# Patient Record
Sex: Female | Born: 1990 | Race: Black or African American | Hispanic: No | Marital: Single | Smoking: Former smoker
Health system: Southern US, Community
[De-identification: ages and names within clinical notes are randomized; demographics above are authoritative.]

## PROBLEM LIST (undated history)

## (undated) DIAGNOSIS — O26649 Intrahepatic cholestasis of pregnancy, unspecified trimester: Secondary | ICD-10-CM

## (undated) DIAGNOSIS — R569 Unspecified convulsions: Secondary | ICD-10-CM

## (undated) DIAGNOSIS — M419 Scoliosis, unspecified: Secondary | ICD-10-CM

## (undated) DIAGNOSIS — J0510 Acute epiglottitis without obstruction: Secondary | ICD-10-CM

## (undated) HISTORY — DX: Intrahepatic cholestasis of pregnancy, unspecified trimester: O26.649

## (undated) HISTORY — PX: NO PAST SURGERIES: SHX2092

## (undated) HISTORY — DX: Scoliosis, unspecified: M41.9

---

## 2017-09-07 ENCOUNTER — Emergency Department (HOSPITAL_COMMUNITY)
Admission: EM | Admit: 2017-09-07 | Discharge: 2017-09-07 | Disposition: A | Discharge status: Home / Self Care | Payer: Self-pay | Attending: Emergency Medicine | Admitting: Emergency Medicine

## 2017-09-07 ENCOUNTER — Encounter (HOSPITAL_COMMUNITY): Payer: Self-pay | Admitting: Emergency Medicine

## 2017-09-07 DIAGNOSIS — K0889 Other specified disorders of teeth and supporting structures: Secondary | ICD-10-CM | POA: Insufficient documentation

## 2017-09-07 DIAGNOSIS — K1120 Sialoadenitis, unspecified: Secondary | ICD-10-CM | POA: Insufficient documentation

## 2017-09-07 DIAGNOSIS — F17210 Nicotine dependence, cigarettes, uncomplicated: Secondary | ICD-10-CM | POA: Insufficient documentation

## 2017-09-07 MED ORDER — OXYCODONE-ACETAMINOPHEN 5-325 MG PO TABS: 1.0000 | ORAL_TABLET | Freq: Four times a day (QID) | ORAL | 0 refills | Status: DC | PRN

## 2017-09-07 NOTE — Discharge Instructions (Addendum)
Please read attached information. If you experience any new or worsening signs or symptoms please return to the emergency room for evaluation. Please follow-up with your primary care provider or specialist as discussed. Please use medication prescribed only as directed and discontinue taking if you have any concerning signs or symptoms.   °

## 2017-09-07 NOTE — ED Provider Notes (Signed)
[facility] EMERGENCY DEPARTMENT Provider Note   CSN: [id number] Arrival date & time: 09/07/17  2053     History   Chief Complaint Chief Complaint  Patient presents with  . Dental Pain    HPI Myia Bergh is a 27 y.o. female.  HPI   27 year old female presents today with complaints of dental pain.  Patient notes that approximately week and half ago she developed pain to her left lower molar, she notes that she did swelling, also some pain on the right side.  She was seen at [facility] on 09/02/2017.  She was diagnosed with parotitis, started on Unasyn and admitted.  Patient was discharged the following day with diagnosis of parotitis and dental caries.  Patient notes she still continues to have pain in the bilateral molars, she notes the swelling has resolved, she denies any fever chills nausea or vomiting, pooling of secretions or difficulty swallowing.  Patient was discharged home on clindamycin and continues to take this.  She is attempting to follow-up with dental resources.   History reviewed. No pertinent past medical history.  There are no active problems to display for this patient.   History reviewed. No pertinent surgical history.   OB History   None      Home Medications    Prior to Admission medications   Medication Sig Start Date End Date Taking? Authorizing Provider  oxyCODONE-acetaminophen (PERCOCET/ROXICET) 5-325 MG tablet Take 1 tablet by mouth every 6 (six) hours as needed for severe pain. 09/07/17   Eyvonne Mechanic, PA-C    Family History No family history on file.  Social History Social History   Tobacco Use  . Smoking status: Current Every Day Smoker    Packs/day: 1.00    Types: Cigarettes  . Smokeless tobacco: Never Used  Substance Use Topics  . Alcohol use: Not Currently  . Drug use: Not Currently     Allergies   Peanut-containing drug products   Review of Systems Review of Systems  All other  systems reviewed and are negative.    Physical Exam Updated Vital Signs BP 134/86 (BP Location: Right Arm)   Pulse 84   Temp 98.6 F (37 C) (Oral)   Resp 18   Ht  (1.6 m)   Wt 49.9 kg (110 lb)   LMP 08/06/2017   SpO2 100%   BMI 19.49 kg/m   Physical Exam  Constitutional: She is oriented to person, place, and time. She appears well-developed and well-nourished.  HENT:  Head: Normocephalic and atraumatic.  Numerous dental caries throughout, gumline palpated nontender, floor the mouth is soft, neck is supple full active range of motion-no significant swelling noted to the bilateral parotid  Eyes: Pupils are equal, round, and reactive to light. Conjunctivae are normal. Right eye exhibits no discharge. Left eye exhibits no discharge. No scleral icterus.  Neck: Normal range of motion. No JVD present. No tracheal deviation present.  Pulmonary/Chest: Effort normal. No stridor.  Neurological: She is alert and oriented to person, place, and time. Coordination normal.  Psychiatric: She has a normal mood and affect. Her behavior is normal. Judgment and thought content normal.  Nursing note and vitals reviewed.    ED Treatments / Results  Labs (all labs ordered are listed, but only abnormal results are displayed) Labs Reviewed - No data to display  EKG None  Radiology No results found.  Procedures Procedures (including critical care time)  Medications Ordered in ED Medications - No data to display  Initial Impression / Assessment and Plan / ED Course  I have reviewed the triage vital signs and the nursing notes.  Pertinent labs & imaging results that were available during my care of the patient were reviewed by me and considered in my medical decision making (see chart for details).     27 year old female presents today with complaints of dental pain.  Patient with recent diagnosis of parotiditis.  She has markedly improved symptoms, she is afebrile well-appearing.   Patient continues to have discomfort, question dental versus ongoing parotitis.  Patient is on antibiotics, she is encouraged to continue using these, she will be given short course of pain medicine, referred to outpatient dentistry.  She is given strict return precautions, she verbalized understanding and agreement to today's plan had no further questions or concerns  Final Clinical Impressions(s) / ED Diagnoses   Final diagnoses:  Pain, dental  Parotitis    ED Discharge Orders        Ordered    oxyCODONE-acetaminophen (PERCOCET/ROXICET) 5-325 MG tablet  Every 6 hours PRN     09/07/17 2115       Eyvonne Mechanic, PA-C 09/07/17 2115    Charlynne Pander, MD 09/07/17 2207

## 2017-09-07 NOTE — ED Triage Notes (Signed)
Pt seen at [facility] 5/17 for dental pain, dx with dental abscess to L lower tooth and parotiditis. Pt was supposed to follow up with dentist but never did. Pt states the dental pain is starting to return.

## 2017-09-14 DIAGNOSIS — F17210 Nicotine dependence, cigarettes, uncomplicated: Secondary | ICD-10-CM | POA: Insufficient documentation

## 2017-09-14 DIAGNOSIS — Z91010 Allergy to peanuts: Secondary | ICD-10-CM | POA: Insufficient documentation

## 2017-09-14 DIAGNOSIS — K0889 Other specified disorders of teeth and supporting structures: Secondary | ICD-10-CM | POA: Insufficient documentation

## 2017-09-15 ENCOUNTER — Emergency Department (HOSPITAL_COMMUNITY)
Admission: EM | Admit: 2017-09-15 | Discharge: 2017-09-15 | Disposition: A | Discharge status: Home / Self Care | Payer: Self-pay | Attending: Emergency Medicine | Admitting: Emergency Medicine

## 2017-09-15 ENCOUNTER — Encounter (HOSPITAL_COMMUNITY): Payer: Self-pay | Admitting: Emergency Medicine

## 2017-09-15 DIAGNOSIS — K0889 Other specified disorders of teeth and supporting structures: Secondary | ICD-10-CM

## 2017-09-15 MED ORDER — CHLORHEXIDINE GLUCONATE 0.12 % MT SOLN: 15.0000 mL | Freq: Two times a day (BID) | OROMUCOSAL | 0 refills | Status: DC

## 2017-09-15 NOTE — ED Notes (Signed)
Discharge instructions and prescription discussed with Pt. Pt verbalized understanding. Pt stable and ambulatory.    

## 2017-09-15 NOTE — ED Triage Notes (Signed)
Pt continues to have dental pain, continues to take antibiotics and has an appointment on the 7th w/ a dentist however is unable to control the pain now.

## 2017-09-15 NOTE — ED Provider Notes (Signed)
[facility]ER[facility]ENC[facility] Provider Note   CSN: [id number] Arrival date & time: 09/14/17  2351     History   Chief Complaint Chief Complaint  Patient presents with  . Dental Pain    HPI [name] is a 27 y.o. female.  Patient presents to the emergency department with a chief complaint of dental pain.  She states that she has had a tooth infection.  She has been taking antibiotics as directed.  She does not have a dentist and needs a referral.  She states that the pain is unbearable.  She denies any other associated symptoms.  The history is provided by the patient. No language interpreter was used.    History reviewed. No pertinent past medical history.  There are no active problems to display for this patient.   History reviewed. No pertinent surgical history.   OB History   None      Home Medications    Prior to Admission medications   Medication Sig Start Date End Date Taking? Authorizing Provider  oxyCODONE-acetaminophen (PERCOCET/ROXICET) 5-325 MG tablet Take 1 tablet by mouth every 6 (six) hours as needed for severe pain. 09/07/17   Eyvonne MechanicHedges, Jeffrey, PA-C    Family History No family history on file.  Social History Social History   Tobacco Use  . Smoking status: Current Every Day Smoker    Packs/day: 1.00    Types: Cigarettes  . Smokeless tobacco: Never Used  Substance Use Topics  . Alcohol use: Not Currently  . Drug use: Not Currently     Allergies   Peanut-containing drug products   Review of Systems Review of Systems  Constitutional: Negative for chills and fever.  HENT: Positive for dental problem. Negative for drooling.   Neurological: Negative for speech difficulty.  Psychiatric/Behavioral: Positive for sleep disturbance.     Physical Exam Updated Vital Signs BP 97/66 (BP Location: Right Arm)   Pulse 64   Temp 97.7 F (36.5 C) (Oral)   Resp 14   Ht 5\' 3"  (1.6 m)   Wt 54.4 kg (120 lb)   SpO2 100%    BMI 21.26 kg/m   Physical Exam Physical Exam  Constitutional: Pt appears well-developed and well-nourished.  HENT:  Head: Normocephalic.  Right Ear: Tympanic membrane, external ear and ear canal normal.  Left Ear: Tympanic membrane, external ear and ear canal normal.  Nose: Nose normal. Right sinus exhibits no maxillary sinus tenderness and no frontal sinus tenderness. Left sinus exhibits no maxillary sinus tenderness and no frontal sinus tenderness.  Mouth/Throat: Uvula is midline, oropharynx is clear and moist and mucous membranes are normal. No oral lesions. No uvula swelling or lacerations. No oropharyngeal exudate, posterior oropharyngeal edema, posterior oropharyngeal erythema or tonsillar abscesses.  Poor dentition No gingival swelling, fluctuance or induration No gross abscess  No sublingual edema, tenderness to palpation, or sign of Ludwig's angina, or deep space infection Pain at left lower rear molar Eyes: Conjunctivae are normal. Pupils are equal, round, and reactive to light. Right eye exhibits no discharge. Left eye exhibits no discharge.  Neck: Normal range of motion. Neck supple.  No stridor Handling secretions without difficulty No nuchal rigidity No cervical lymphadenopathy Cardiovascular: Normal rate, regular rhythm and normal heart sounds.   Pulmonary/Chest: Effort normal. No respiratory distress.  Equal chest rise  Abdominal: Soft. Bowel sounds are normal. Pt exhibits no distension. There is no tenderness.  Lymphadenopathy: Pt has no cervical adenopathy.  Neurological: Pt is alert and oriented x 4  Skin: Skin is warm and dry.  Psychiatric: Pt has a normal mood and affect.  Nursing note and vitals reviewed.    ED Treatments / Results  Labs (all labs ordered are listed, but only abnormal results are displayed) Labs Reviewed - No data to display  EKG None  Radiology No results found.  Procedures Procedures (including critical care  time)  Medications Ordered in ED Medications - No data to display   Initial Impression / Assessment and Plan / ED Course  I have reviewed the triage vital signs and the nursing notes.  Pertinent labs & imaging results that were available during my care of the patient were reviewed by me and considered in my medical decision making (see chart for details).     Patient with dentalgia.  No abscess requiring immediate incision and drainage.  Exam not concerning for Ludwig's angina or pharyngeal abscess.  Will treat with periodex; currently taking oral abx. Pt instructed to follow-up with dentist.  Discussed return precautions. Pt safe for discharge.   Final Clinical Impressions(s) / ED Diagnoses   Final diagnoses:  Pain, dental    ED Discharge Orders        Ordered    chlorhexidine (PERIDEX) 0.12 % solution  2 times daily     09/15/17 0537       Roxy Horseman, PA-C 09/15/17 1610    Geoffery Lyons, MD 09/16/17 0002

## 2018-03-18 ENCOUNTER — Other Ambulatory Visit: Payer: Self-pay

## 2018-03-18 ENCOUNTER — Encounter (HOSPITAL_COMMUNITY): Payer: Self-pay | Admitting: *Deleted

## 2018-03-18 ENCOUNTER — Emergency Department (HOSPITAL_COMMUNITY)
Admission: EM | Admit: 2018-03-18 | Discharge: 2018-03-18 | Disposition: A | Discharge status: Home / Self Care | Payer: Self-pay | Attending: Emergency Medicine | Admitting: Emergency Medicine

## 2018-03-18 DIAGNOSIS — Z79899 Other long term (current) drug therapy: Secondary | ICD-10-CM | POA: Insufficient documentation

## 2018-03-18 DIAGNOSIS — F17210 Nicotine dependence, cigarettes, uncomplicated: Secondary | ICD-10-CM | POA: Insufficient documentation

## 2018-03-18 DIAGNOSIS — K0889 Other specified disorders of teeth and supporting structures: Secondary | ICD-10-CM | POA: Insufficient documentation

## 2018-03-18 MED ORDER — NAPROXEN 500 MG PO TABS: 500.0000 mg | ORAL_TABLET | Freq: Two times a day (BID) | ORAL | 0 refills | Status: DC

## 2018-03-18 MED ORDER — ACETAMINOPHEN 325 MG PO TABS
650.0000 mg | ORAL_TABLET | Freq: Once | ORAL | Status: AC
Start: 2018-03-18 — End: 2018-03-18
  Administered 2018-03-18: 650 mg via ORAL
  Filled 2018-03-18: qty 2

## 2018-03-18 MED ORDER — CHLORHEXIDINE GLUCONATE 0.12 % MT SOLN
15.0000 mL | Freq: Two times a day (BID) | OROMUCOSAL | 0 refills | Status: DC
Start: 2018-03-18 — End: 2018-08-01

## 2018-03-18 MED ORDER — AMOXICILLIN-POT CLAVULANATE 875-125 MG PO TABS: 1.0000 | ORAL_TABLET | Freq: Two times a day (BID) | ORAL | 0 refills | Status: AC

## 2018-03-18 NOTE — ED Provider Notes (Signed)
[facility] EMERGENCY DEPARTMENT Provider Note   CSN: [id number] Arrival date & time: 03/18/18  4098     History   Chief Complaint Chief Complaint  Patient presents with  . Dental Pain    HPI [name] is a 27 y.o. female presenting with intermittent left sided dental pain onset yesterday at 9am. Patient describes pain as sharp and states it is non radiating. Patient reports she has tried ibuprofen, tylenol, and warm compress without relief. Patient states she has had dental infections in the past. Patient reports a subjective fever, left sided facial swelling, and left ear pain. Pt denies dysphagia, shortness of breath, abdominal pain, nausea, or vomiting. Patient states she is able to eat and swallow secretions.   HPI  History reviewed. No pertinent past medical history.  There are no active problems to display for this patient.   History reviewed. No pertinent surgical history.   OB History   None      Home Medications    Prior to Admission medications   Medication Sig Start Date End Date Taking? Authorizing Provider  amoxicillin-clavulanate (AUGMENTIN) 875-125 MG tablet Take 1 tablet by mouth 2 (two) times daily for 7 days. 03/18/18 03/25/18  Carlyle Basques P, PA-C  chlorhexidine (PERIDEX) 0.12 % solution Use as directed 15 mLs in the mouth or throat 2 (two) times daily. 03/18/18   Carlyle Basques P, PA-C  naproxen (NAPROSYN) 500 MG tablet Take 1 tablet (500 mg total) by mouth 2 (two) times daily. 03/18/18   Carlyle Basques P, PA-C  oxyCODONE-acetaminophen (PERCOCET/ROXICET) 5-325 MG tablet Take 1 tablet by mouth every 6 (six) hours as needed for severe pain. 09/07/17   Eyvonne Mechanic, PA-C    Family History History reviewed. No pertinent family history.  Social History Social History   Tobacco Use  . Smoking status: Current Every Day Smoker    Packs/day: 1.00    Types: Cigarettes  . Smokeless tobacco: Never Used  Substance Use Topics  .  Alcohol use: Not Currently  . Drug use: Not Currently     Allergies   Peanut-containing drug products   Review of Systems Review of Systems  Constitutional: Positive for fever. Negative for chills and diaphoresis.  HENT: Positive for dental problem, ear pain and facial swelling. Negative for congestion, drooling, rhinorrhea, sore throat, trouble swallowing and voice change.   Respiratory: Negative for cough and shortness of breath.   Gastrointestinal: Negative for abdominal pain, nausea and vomiting.  Skin: Negative for wound.  Allergic/Immunologic: Negative for immunocompromised state.  Hematological: Negative for adenopathy.  Psychiatric/Behavioral: The patient is not nervous/anxious.      Physical Exam Updated Vital Signs BP (!) 118/96 (BP Location: Right Arm)   Pulse 95   Temp 98.4 F (36.9 C) (Oral)   Resp 16   Ht 5\' 3"  (1.6 m)   Wt 49.9 kg   LMP 02/27/2018   SpO2 100%   BMI 19.49 kg/m   Physical Exam  Constitutional: She appears well-developed and well-nourished. No distress.  HENT:  Head: Normocephalic and atraumatic.  Right Ear: External ear and ear canal normal. No mastoid tenderness. No decreased hearing is noted.  Left Ear: External ear and ear canal normal. No mastoid tenderness. No decreased hearing is noted.  Nose: Nose normal.  Mouth/Throat: Uvula is midline, oropharynx is clear and moist and mucous membranes are normal. No trismus in the jaw. Dental abscesses and dental caries present.  Multiple dental caries noted on exam. Small dental abscess noted  on left upper dental molar. Abscess is small and does not require incision and drainage at this time. Left sided facial edema noted on exam. Patient is able to speak in full sentences and swallow secretions without difficulty. No drooling present. Bilateral TMs were not visualized due to large amount of ear wax present. No signs of ludwig's angina noted on exam.   Neck: Normal range of motion. Neck supple.    Cardiovascular: Normal rate, regular rhythm and normal heart sounds. Exam reveals no gallop and no friction rub.  No murmur heard. Pulmonary/Chest: Effort normal and breath sounds normal. No respiratory distress. She has no wheezes.  Abdominal: Soft. She exhibits no distension. There is no tenderness. There is no guarding.  Musculoskeletal: Normal range of motion.  Neurological: She is alert.  Skin: Skin is warm. No rash noted. She is not diaphoretic. No erythema.  Psychiatric: She has a normal mood and affect.  Nursing note and vitals reviewed.    ED Treatments / Results  Labs (all labs ordered are listed, but only abnormal results are displayed) Labs Reviewed - No data to display  EKG None  Radiology No results found.  Procedures Procedures (including critical care time)  Medications Ordered in ED Medications  acetaminophen (TYLENOL) tablet 650 mg (650 mg Oral Given 03/18/18 0931)     Initial Impression / Assessment and Plan / ED Course  I have reviewed the triage vital signs and the nursing notes.  Pertinent labs & imaging results that were available during my care of the patient were reviewed by me and considered in my medical decision making (see chart for details).    Patient with toothache and a small dental abscess noted. Dental abscess does not appear to require incision and drainage at this time. Provided tylenol for pain control. Exam unconcerning for Ludwig's angina or spread of infection.  Will treat with Augmentin and anti-inflammatories medicine.  Urged patient to follow-up with dentist.     Final Clinical Impressions(s) / ED Diagnoses   Final diagnoses:  Pain, dental    ED Discharge Orders         Ordered    naproxen (NAPROSYN) 500 MG tablet  2 times daily     03/18/18 0937    amoxicillin-clavulanate (AUGMENTIN) 875-125 MG tablet  2 times daily     03/18/18 0937    chlorhexidine (PERIDEX) 0.12 % solution  2 times daily     03/18/18 [id number]            Leretha DykesHernandez, Lillis Nuttle P, PA-C 03/18/18 0940    Bethann BerkshireZammit, Joseph, MD 03/18/18 1048

## 2018-03-18 NOTE — ED Triage Notes (Signed)
Pt reports Lt sided dental pain with facial swelling on Lt side of face. Pt unsure of tooth is broken.

## 2018-03-18 NOTE — ED Notes (Signed)
Declined W/C at D/C and was escorted to lobby by RN. 

## 2018-03-18 NOTE — Discharge Instructions (Signed)
You have been seen today for dental pain. Please read and follow all provided instructions.   1. Medications: naproxen for pain, Augmentin (antibiotic) for dental infection, Peridex solution for dental pain, usual home medications 2. Treatment: rest, drink plenty of fluids 3. Follow Up: Please follow up with your primary doctor in 3 days for discussion of your diagnoses and further evaluation after today's visit; if you do not have a primary care doctor use the resource guide provided to find one; Please return to the ER for any new or worsening symptoms.   Take medications as prescribed. Return to the emergency room for worsening condition or new concerning symptoms. Follow up with your regular doctor. If you don't have a regular doctor use one of the numbers below to establish a primary care doctor.   Emergency Department Resource Guide 1) Find a Doctor and Pay Out of Pocket Although you won't have to find out who is covered by your insurance plan, it is a good idea to ask around and get recommendations. You will then need to call the office and see if the doctor you have chosen will accept you as a new patient and what types of options they offer for patients who are self-pay. Some doctors offer discounts or will set up payment plans for their patients who do not have insurance, but you will need to ask so you aren't surprised when you get to your appointment.  2) Contact Your Local Health Department Not all health departments have doctors that can see patients for sick visits, but many do, so it is worth a call to see if yours does. If you don't know where your local health department is, you can check in your phone book. The CDC also has a tool to help you locate your state's health department, and many state websites also have listings of all of their local health departments.  3) Find a Walk-in Clinic If your illness is not likely to be very severe or complicated, you may want to try a walk  in clinic. These are popping up all over the country in pharmacies, drugstores, and shopping centers. They're usually staffed by nurse practitioners or physician assistants that have been trained to treat common illnesses and complaints. They're usually fairly quick and inexpensive. However, if you have serious medical issues or chronic medical problems, these are probably not your best option.  No Primary Care Doctor: Call Health Connect at  [phone] - they can help you locate a primary care doctor that  accepts your insurance, provides certain services, etc. Physician Referral [phone]  Emergency Department Resource Guide 1) Find a Doctor and Pay Out of Pocket Although you won't have to find out who is covered by your insurance plan, it is a good idea to ask around and get recommendations. You will then need to call the office and see if the doctor you have chosen will accept you as a new patient and what types of options they offer for patients who are self-pay. Some doctors offer discounts or will set up payment plans for their patients who do not have insurance, but you will need to ask so you aren't surprised when you get to your appointment.  2) Contact Your Local Health Department Not all health departments have doctors that can see patients for sick visits, but many do, so it is worth a call to see if yours does. If you don't know where your local health department is, you can check in  your phone book. The CDC also has a tool to help you locate your state's health department, and many state websites also have listings of all of their local health departments.  3) Find a Walk-in Clinic If your illness is not likely to be very severe or complicated, you may want to try a walk in clinic. These are popping up all over the country in pharmacies, drugstores, and shopping centers. They're usually staffed by nurse practitioners or physician assistants that have been trained to treat  common illnesses and complaints. They're usually fairly quick and inexpensive. However, if you have serious medical issues or chronic medical problems, these are probably not your best option.  No Primary Care Doctor: Call Health Connect at  [phone] - they can help you locate a primary care doctor that  accepts your insurance, provides certain services, etc. Physician Referral Service- [phone]  Chronic Pain Problems: Organization         Address  Phone   Notes  [facility]  [phone] Patients need to be referred by their primary care doctor.   Medication Assistance: Organization         Address  Phone   Notes  [facility] Medication [facility] [address] [place] [place], [place] [address] [phone] --Must be a resident of [facility] -- Must have NO insurance coverage whatsoever (no Medicaid/ Medicare, etc.) -- The pt. MUST have a primary care doctor that directs their care regularly and follows them in the community   [facility]  [phone]   [employer]  [phone]    Agencies that provide inexpensive medical care: Organization         Address  Phone   Notes  [facility]  [phone]   [facility]    [phone]   [facility] [address] [place], [place] [address] [phone]   [facility] [address] [place] [place] [phone]   [facility]    [phone]   [facility]    [phone]   [facility]  [address], [place] Phone:  [phone], Fax:  [phone] Hours of Operation:  9 am - 6 pm, M-F.  Also accepts Medicaid/Medicare and self-pay.  [facility]  [address], [place] Phone: [phone], Fax: [phone]. Hours of Operation:  8:30 am - 5:30 pm, M-F.  Also accepts Medicaid and self-pay.  [facility] [place] [address], [place] Phone: [phone]   [facility] [address] Natasha Bence [place], [place] [phone], Ext. [phone] Mondays & Thursdays: 7-9 AM.  First 15 patients are seen on a first come, first serve basis.    Medicaid-accepting [facility] Providers:  Organization         Address  Phone   Notes  [facility] [address], [place] [phone] Also accepts self-pay patients.  [facility] [address] Laurell Josephs [place], [place]  [phone]   [facility] [address], [place] [phone]   [facility] [address], [place] [phone]   Renaye Rakers [address], [place]   [phone] Only accepts [place] patients after they have their name applied to their card.  Self-Pay (no insurance) in [place]:  Organization         Address  Phone   Notes  Sickle Cell Patients, [facility] [address], [place] [phone]   [facility] [address], [place] [phone]) [phone]   [facility]  [address], [place] [phone]   [facility]/Dr. Osei-Bonsu  [address] [place] [address], [place] or [phone], [place] [phone]) [phone] Phone number for both [place] and [place] locations is the same.  [facility] [address], [place] [phone](336) [phone]   [facility] [place] [address] [place] [address], [place] or [address] [phone]   [facility] [address], [place] [phone]) [phone], phone; [phone], fax Sees patients 1st and 3rd Saturday of every month.  Must not qualify for public or private insurance (i.e. Medicaid, Medicare,  Health Choice, Veterans' Benefits)  Household income should be no more than 200% of the  poverty level The clinic cannot treat you if you are pregnant or think you are pregnant  Sexually transmitted diseases are not treated at the clinic.

## 2018-06-25 ENCOUNTER — Encounter (HOSPITAL_COMMUNITY): Payer: Self-pay | Admitting: Emergency Medicine

## 2018-06-25 ENCOUNTER — Emergency Department (HOSPITAL_COMMUNITY)
Admission: EM | Admit: 2018-06-25 | Discharge: 2018-06-26 | Disposition: A | Discharge status: Home / Self Care | Payer: Self-pay | Attending: Emergency Medicine | Admitting: Emergency Medicine

## 2018-06-25 ENCOUNTER — Other Ambulatory Visit: Payer: Self-pay

## 2018-06-25 DIAGNOSIS — J029 Acute pharyngitis, unspecified: Secondary | ICD-10-CM | POA: Insufficient documentation

## 2018-06-25 DIAGNOSIS — R0981 Nasal congestion: Secondary | ICD-10-CM | POA: Insufficient documentation

## 2018-06-25 DIAGNOSIS — R112 Nausea with vomiting, unspecified: Secondary | ICD-10-CM | POA: Insufficient documentation

## 2018-06-25 DIAGNOSIS — R509 Fever, unspecified: Secondary | ICD-10-CM | POA: Insufficient documentation

## 2018-06-25 DIAGNOSIS — R197 Diarrhea, unspecified: Secondary | ICD-10-CM | POA: Insufficient documentation

## 2018-06-25 DIAGNOSIS — F17210 Nicotine dependence, cigarettes, uncomplicated: Secondary | ICD-10-CM | POA: Insufficient documentation

## 2018-06-25 LAB — COMPREHENSIVE METABOLIC PANEL
ALT: 11 U/L (ref 0–44)
AST: 24 U/L (ref 15–41)
Albumin: 3.6 g/dL (ref 3.5–5.0)
Alkaline Phosphatase: 53 U/L (ref 38–126)
Anion gap: 5 (ref 5–15)
BUN: 10 mg/dL (ref 6–20)
CHLORIDE: 105 mmol/L (ref 98–111)
CO2: 28 mmol/L (ref 22–32)
CREATININE: 0.83 mg/dL (ref 0.44–1.00)
Calcium: 8.7 mg/dL — ABNORMAL LOW (ref 8.9–10.3)
GFR calc Af Amer: >60 mL/min (ref >60)
Glucose, Bld: 117 mg/dL — ABNORMAL HIGH (ref 70–99)
POTASSIUM: 3.7 mmol/L (ref 3.5–5.1)
Sodium: 138 mmol/L (ref 135–145)
TOTAL PROTEIN: 6.5 g/dL (ref 6.5–8.1)
Total Bilirubin: 1.2 mg/dL (ref 0.3–1.2)

## 2018-06-25 LAB — URINALYSIS, ROUTINE W REFLEX MICROSCOPIC
BILIRUBIN URINE: NEGATIVE
GLUCOSE, UA: NEGATIVE mg/dL
Ketones, ur: NEGATIVE mg/dL
Nitrite: NEGATIVE
Protein, ur: NEGATIVE mg/dL
SPECIFIC GRAVITY, URINE: 1.021 (ref 1.005–1.030)
pH: 5 (ref 5.0–8.0)

## 2018-06-25 LAB — I-STAT BETA HCG BLOOD, ED (MC, WL, AP ONLY)

## 2018-06-25 LAB — CBC
HEMATOCRIT: 40.3 % (ref 36.0–46.0)
HEMOGLOBIN: 13.7 g/dL (ref 12.0–15.0)
MCH: 29.3 pg (ref 26.0–34.0)
MCHC: 34 g/dL (ref 30.0–36.0)
MCV: 86.1 fL (ref 80.0–100.0)
Platelets: 204 10*3/uL (ref 150–400)
RBC: 4.68 MIL/uL (ref 3.87–5.11)
RDW: 12.7 % (ref 11.5–15.5)
WBC: 4.3 10*3/uL (ref 4.0–10.5)
nRBC: 0 % (ref 0.0–0.2)

## 2018-06-25 LAB — LIPASE, BLOOD: LIPASE: 21 U/L (ref 11–51)

## 2018-06-25 MED ORDER — SODIUM CHLORIDE 0.9% FLUSH: 3.0000 mL | Freq: Once | INTRAVENOUS | Status: DC

## 2018-06-25 NOTE — ED Triage Notes (Signed)
Pt c/o nausea/vomiting/diarrhea and chills that started today. Denies abdominal pain or urinary symptoms.

## 2018-06-26 LAB — INFLUENZA PANEL BY PCR (TYPE A & B)
Influenza A By PCR: NEGATIVE
Influenza B By PCR: NEGATIVE

## 2018-06-26 MED ORDER — ONDANSETRON 4 MG PO TBDP
4.0000 mg | ORAL_TABLET | Freq: Once | ORAL | Status: AC
  Administered 2018-06-26: 4 mg via ORAL
  Filled 2018-06-26: qty 1

## 2018-06-26 MED ORDER — ACETAMINOPHEN 500 MG PO TABS
1000.0000 mg | ORAL_TABLET | Freq: Once | ORAL | Status: AC
  Administered 2018-06-26: 1000 mg via ORAL
  Filled 2018-06-26: qty 2

## 2018-06-26 MED ORDER — ONDANSETRON 4 MG PO TBDP: 4.0000 mg | ORAL_TABLET | Freq: Three times a day (TID) | ORAL | 0 refills | Status: DC | PRN

## 2018-06-26 MED ORDER — ONDANSETRON HCL 4 MG/2ML IJ SOLN: 4.0000 mg | Freq: Once | INTRAMUSCULAR | Status: DC

## 2018-06-26 NOTE — Discharge Instructions (Addendum)
1. Medications: Alternate 600 mg of ibuprofen and [id number] mg of Tylenol every 3 hours as needed for pain or fever. Do not exceed 4000 mg of Tylenol daily.  Take ibuprofen with food to avoid upset stomach.  Take Zofran as needed for nausea.  Let this dissolve under your tongue and wait around 10-20 minutes before eating or drinking after taking this medication. 2. Treatment: rest, drink plenty of fluids, advance diet slowly.  Start with water and broth then advance to bland foods that will not upset your stomach such as crackers, mashed potatoes, and peanut butter. 3. Follow Up: Please followup with your primary doctor in 3 days for discussion of your diagnoses and further evaluation after today's visit; if you do not have a primary care doctor use the resource guide provided to find one; Please return to the ER for persistent vomiting, high fevers or worsening symptoms

## 2018-06-26 NOTE — ED Notes (Signed)
Patient given ginger ale. 

## 2018-06-26 NOTE — ED Provider Notes (Signed)
[name] EMERGENCY DEPARTMENT Provider Note   CSN: [id number] Arrival date & time: 06/25/18  1848    History   Chief Complaint Chief Complaint  Patient presents with  . Emesis  . Diarrhea    HPI [name] is a 28 y.o. female with no significant past medical history presents today for evaluation of cute onset, progressively worsening nausea vomiting and diarrhea yesterday as well as flulike symptoms 2 days ago.  She reports sore throat, mild nasal congestion.  Has had approximately 10 episodes of nonbloody nonbilious emesis and watery nonbloody diarrhea yesterday into today.  Denies abdominal pain, urinary symptoms, melena, hematochezia, shortness of breath, or chest pain.  Notes fevers and chills.  Endorses decreased appetite and decreased oral intake.  Has not tried anything for her symptoms.  Notes recent sick contact with the flu.  Denies suspicious food intake, recent travel, or recent treatment with antibiotics.     The history is provided by the patient.    History reviewed. No pertinent past medical history.  There are no active problems to display for this patient.   History reviewed. No pertinent surgical history.   OB History   No obstetric history on file.      Home Medications    Prior to Admission medications   Medication Sig Start Date End Date Taking? Authorizing Provider  chlorhexidine (PERIDEX) 0.12 % solution Use as directed 15 mLs in the mouth or throat 2 (two) times daily. Patient not taking: Reported on 06/26/2018 03/18/18   Carlyle Basques P, PA-C  naproxen (NAPROSYN) 500 MG tablet Take 1 tablet (500 mg total) by mouth 2 (two) times daily. Patient not taking: Reported on 06/26/2018 03/18/18   Carlyle Basques P, PA-C  ondansetron (ZOFRAN ODT) 4 MG disintegrating tablet Take 1 tablet (4 mg total) by mouth every 8 (eight) hours as needed for nausea or vomiting. 06/26/18   Michela Pitcher A, PA-C  oxyCODONE-acetaminophen  (PERCOCET/ROXICET) 5-325 MG tablet Take 1 tablet by mouth every 6 (six) hours as needed for severe pain. Patient not taking: Reported on 06/26/2018 09/07/17   Eyvonne Mechanic, PA-C    Family History No family history on file.  Social History Social History   Tobacco Use  . Smoking status: Current Every Day Smoker    Packs/day: 1.00    Types: Cigarettes  . Smokeless tobacco: Never Used  Substance Use Topics  . Alcohol use: Not Currently  . Drug use: Not Currently     Allergies   Peanut-containing drug products   Review of Systems Review of Systems  Constitutional: Positive for chills and fever.  HENT: Positive for congestion and sore throat.   Respiratory: Negative for shortness of breath.   Cardiovascular: Negative for chest pain.  Gastrointestinal: Positive for diarrhea, nausea and vomiting. Negative for abdominal pain.  Genitourinary: Negative for dysuria, frequency, hematuria and urgency.  All other systems reviewed and are negative.    Physical Exam Updated Vital Signs BP 98/61   Pulse 100   Temp 98.2 F (36.8 C) (Oral)   Resp 17   LMP 05/27/2018   SpO2 98%   Physical Exam Vitals signs and nursing note reviewed.  Constitutional:      General: She is not in acute distress.    Appearance: She is well-developed.  HENT:     Head: Normocephalic and atraumatic.     Mouth/Throat:     Mouth: Mucous membranes are dry.     Pharynx: No oropharyngeal exudate or posterior oropharyngeal  erythema.     Comments: No tonsillar hypertrophy.  Tolerating secretions without difficulty. Eyes:     General:        Right eye: No discharge.        Left eye: No discharge.     Conjunctiva/sclera: Conjunctivae normal.  Neck:     Musculoskeletal: Normal range of motion and neck supple.     Vascular: No JVD.     Trachea: No tracheal deviation.  Cardiovascular:     Rate and Rhythm: Regular rhythm. Tachycardia present.     Pulses: Normal pulses.     Heart sounds: Normal heart  sounds.  Pulmonary:     Effort: Pulmonary effort is normal. No respiratory distress.     Breath sounds: Normal breath sounds.  Abdominal:     General: Abdomen is flat. Bowel sounds are normal. There is no distension.     Palpations: Abdomen is soft.     Tenderness: There is no abdominal tenderness. There is no right CVA tenderness, left CVA tenderness, guarding or rebound.  Skin:    General: Skin is warm.     Findings: No erythema.  Neurological:     Mental Status: She is alert.  Psychiatric:        Behavior: Behavior normal.      ED Treatments / Results  Labs (all labs ordered are listed, but only abnormal results are displayed) Labs Reviewed  COMPREHENSIVE METABOLIC PANEL - Abnormal; Notable for the following components:      Result Value   Glucose, Bld 117 (*)    Calcium 8.7 (*)    All other components within normal limits  URINALYSIS, ROUTINE W REFLEX MICROSCOPIC - Abnormal; Notable for the following components:   APPearance HAZY (*)    Hgb urine dipstick SMALL (*)    Leukocytes,Ua MODERATE (*)    Bacteria, UA FEW (*)    All other components within normal limits  URINE CULTURE  LIPASE, BLOOD  CBC  INFLUENZA PANEL BY PCR (TYPE A & B)  I-STAT BETA HCG BLOOD, ED ([facility], [facility], [facility] ONLY)    EKG None  Radiology No results found.  Procedures Procedures (including critical care time)  Medications Ordered in ED Medications  acetaminophen (TYLENOL) tablet 1,000 mg (1,000 mg Oral Given 06/26/18 0517)  ondansetron (ZOFRAN-ODT) disintegrating tablet 4 mg (4 mg Oral Given 06/26/18 0544)     Initial Impression / Assessment and Plan / ED Course  I have reviewed the triage vital signs and the nursing notes.  Pertinent labs & imaging results that were available during my care of the patient were reviewed by me and considered in my medical decision making (see chart for details).  Patient presenting for evaluation of nausea vomiting diarrhea, fevers, myalgias, sore throat since  yesterday.  Febrile and tachycardic in the ED initially with resolution after administration of Tylenol.  Abdomen is soft and nontender.  Lab work shows no leukocytosis, no anemia, no metabolic derangements.  No renal insufficiency, LFTs and lipase within normal limits.  Her UA equivocal for UTI but she has no symptoms.  We discussed treatment versus culturing and she would like to hold off on any antibiotic treatment which I think is reasonable.  Doubt acute surgical abdominal pathology given benign serial abdominal examinations.  She was given Zofran in the ED and was able to tolerate p.o. fluids without difficulty.  Suspect gastroenteritis, likely viral in etiology.  Will discharge with course of Zofran.  Discussed pushing fluids, advancing diet slowly.  Discussed  strict ED return precautions.Pt verbalized understanding of and agreement with plan and is safe for discharge home at this time.   Final Clinical Impressions(s) / ED Diagnoses   Final diagnoses:  Nausea vomiting and diarrhea    ED Discharge Orders         Ordered    ondansetron (ZOFRAN ODT) 4 MG disintegrating tablet  Every 8 hours PRN     06/26/18 0649           Jeanie Sewer, PA-C 06/27/18 0313    Palumbo, April, MD 06/27/18 2304

## 2018-06-28 ENCOUNTER — Other Ambulatory Visit: Payer: Self-pay

## 2018-06-28 ENCOUNTER — Inpatient Hospital Stay (HOSPITAL_COMMUNITY)
Admission: EM | Admit: 2018-06-28 | Discharge: 2018-07-01 | DRG: 153 | Disposition: A | Discharge status: Home / Self Care | Payer: Self-pay | Attending: Internal Medicine | Admitting: Internal Medicine

## 2018-06-28 ENCOUNTER — Encounter (HOSPITAL_COMMUNITY): Payer: Self-pay | Admitting: Emergency Medicine

## 2018-06-28 ENCOUNTER — Emergency Department (HOSPITAL_COMMUNITY): Payer: Self-pay

## 2018-06-28 DIAGNOSIS — J36 Peritonsillar abscess: Secondary | ICD-10-CM | POA: Diagnosis present

## 2018-06-28 DIAGNOSIS — J0510 Acute epiglottitis without obstruction: Principal | ICD-10-CM | POA: Diagnosis present

## 2018-06-28 DIAGNOSIS — F17210 Nicotine dependence, cigarettes, uncomplicated: Secondary | ICD-10-CM | POA: Diagnosis present

## 2018-06-28 DIAGNOSIS — Z79899 Other long term (current) drug therapy: Secondary | ICD-10-CM

## 2018-06-28 DIAGNOSIS — Z716 Tobacco abuse counseling: Secondary | ICD-10-CM

## 2018-06-28 DIAGNOSIS — Z91010 Allergy to peanuts: Secondary | ICD-10-CM

## 2018-06-28 HISTORY — DX: Acute epiglottitis without obstruction: J05.10

## 2018-06-28 LAB — URINE CULTURE

## 2018-06-28 LAB — URINALYSIS, ROUTINE W REFLEX MICROSCOPIC
Bilirubin Urine: NEGATIVE
GLUCOSE, UA: NEGATIVE mg/dL
Ketones, ur: NEGATIVE mg/dL
Nitrite: NEGATIVE
PH: 5 (ref 5.0–8.0)
PROTEIN: NEGATIVE mg/dL
Specific Gravity, Urine: 1.01 (ref 1.005–1.030)

## 2018-06-28 LAB — CBC
HCT: 38.2 % (ref 36.0–46.0)
Hemoglobin: 13.4 g/dL (ref 12.0–15.0)
MCH: 30.1 pg (ref 26.0–34.0)
MCHC: 35.1 g/dL (ref 30.0–36.0)
MCV: 85.8 fL (ref 80.0–100.0)
NRBC: 0 % (ref 0.0–0.2)
PLATELETS: 171 10*3/uL (ref 150–400)
RBC: 4.45 MIL/uL (ref 3.87–5.11)
RDW: 12.7 % (ref 11.5–15.5)
WBC: 7.8 10*3/uL (ref 4.0–10.5)

## 2018-06-29 ENCOUNTER — Encounter (HOSPITAL_COMMUNITY): Payer: Self-pay | Admitting: General Practice

## 2018-06-29 ENCOUNTER — Emergency Department (HOSPITAL_COMMUNITY): Payer: Self-pay

## 2018-06-29 ENCOUNTER — Other Ambulatory Visit: Payer: Self-pay

## 2018-06-29 DIAGNOSIS — J0510 Acute epiglottitis without obstruction: Secondary | ICD-10-CM

## 2018-06-29 HISTORY — DX: Acute epiglottitis without obstruction: J05.10

## 2018-06-29 LAB — PROTIME-INR
INR: 1.1 (ref 0.8–1.2)
Prothrombin Time: 14.4 seconds (ref 11.4–15.2)

## 2018-06-29 LAB — BASIC METABOLIC PANEL
Anion gap: 3 — ABNORMAL LOW (ref 5–15)
BUN: 6 mg/dL (ref 6–20)
CALCIUM: 8.3 mg/dL — AB (ref 8.9–10.3)
CO2: 22 mmol/L (ref 22–32)
CREATININE: 0.91 mg/dL (ref 0.44–1.00)
Chloride: 108 mmol/L (ref 98–111)
GFR calc non Af Amer: >60 mL/min (ref >60)
Glucose, Bld: 121 mg/dL — ABNORMAL HIGH (ref 70–99)
Potassium: 3.9 mmol/L (ref 3.5–5.1)
SODIUM: 133 mmol/L — AB (ref 135–145)

## 2018-06-29 LAB — HEPATIC FUNCTION PANEL
ALBUMIN: 3.3 g/dL — AB (ref 3.5–5.0)
ALT: 13 U/L (ref 0–44)
AST: 23 U/L (ref 15–41)
Alkaline Phosphatase: 48 U/L (ref 38–126)
BILIRUBIN INDIRECT: 0.5 mg/dL (ref 0.3–0.9)
Bilirubin, Direct: 0.2 mg/dL (ref 0.0–0.2)
TOTAL PROTEIN: 6.5 g/dL (ref 6.5–8.1)
Total Bilirubin: 0.7 mg/dL (ref 0.3–1.2)

## 2018-06-29 LAB — GROUP A STREP BY PCR: Group A Strep by PCR: NOT DETECTED

## 2018-06-29 LAB — SURGICAL PCR SCREEN
MRSA, PCR: NEGATIVE
STAPHYLOCOCCUS AUREUS: NEGATIVE

## 2018-06-29 LAB — LACTIC ACID, PLASMA
Lactic Acid, Venous: 0.7 mmol/L (ref 0.5–1.9)
Lactic Acid, Venous: 0.9 mmol/L (ref 0.5–1.9)

## 2018-06-29 LAB — APTT: aPTT: 32 seconds (ref 24–36)

## 2018-06-29 LAB — PROCALCITONIN: Procalcitonin: 0.23 ng/mL

## 2018-06-29 MED ORDER — SODIUM CHLORIDE 0.9 % IV BOLUS
500.0000 mL | Freq: Once | INTRAVENOUS | Status: AC
  Administered 2018-06-29: 500 mL via INTRAVENOUS

## 2018-06-29 MED ORDER — SODIUM CHLORIDE 0.9% FLUSH
3.0000 mL | Freq: Two times a day (BID) | INTRAVENOUS | Status: DC
  Administered 2018-06-29 – 2018-06-30 (×2): 3 mL via INTRAVENOUS

## 2018-06-29 MED ORDER — DIPHENHYDRAMINE HCL 50 MG/ML IJ SOLN
25.0000 mg | Freq: Once | INTRAMUSCULAR | Status: AC
  Administered 2018-06-29: 25 mg via INTRAVENOUS
  Filled 2018-06-29: qty 1

## 2018-06-29 MED ORDER — SODIUM CHLORIDE 0.9 % IV SOLN
INTRAVENOUS | Status: AC
  Administered 2018-06-30: 02:00:00 via INTRAVENOUS

## 2018-06-29 MED ORDER — BISACODYL 5 MG PO TBEC: 5.0000 mg | DELAYED_RELEASE_TABLET | Freq: Every day | ORAL | Status: DC | PRN

## 2018-06-29 MED ORDER — HYDROCODONE-ACETAMINOPHEN 7.5-325 MG/15ML PO SOLN
10.0000 mL | ORAL | Status: DC | PRN
  Administered 2018-06-29: 10 mL via ORAL
  Filled 2018-06-29: qty 15

## 2018-06-29 MED ORDER — POLYETHYLENE GLYCOL 3350 17 G PO PACK: 17.0000 g | PACK | Freq: Every day | ORAL | Status: DC | PRN

## 2018-06-29 MED ORDER — SODIUM CHLORIDE 0.9 % IV SOLN
2.0000 g | INTRAVENOUS | Status: DC
  Administered 2018-06-29 – 2018-06-30 (×2): 2 g via INTRAVENOUS
  Filled 2018-06-29 (×2): qty 20

## 2018-06-29 MED ORDER — ACETAMINOPHEN 325 MG PO TABS
650.0000 mg | ORAL_TABLET | Freq: Four times a day (QID) | ORAL | Status: DC | PRN
  Administered 2018-06-29 – 2018-06-30 (×2): 650 mg via ORAL
  Filled 2018-06-29 (×2): qty 2

## 2018-06-29 MED ORDER — DEXAMETHASONE SODIUM PHOSPHATE 4 MG/ML IJ SOLN
4.0000 mg | Freq: Four times a day (QID) | INTRAMUSCULAR | Status: DC
  Administered 2018-06-29 – 2018-07-01 (×8): 4 mg via INTRAVENOUS
  Filled 2018-06-29 (×8): qty 1

## 2018-06-29 MED ORDER — HYDROCODONE-ACETAMINOPHEN 7.5-325 MG/15ML PO SOLN
10.0000 mL | Freq: Once | ORAL | Status: AC
  Administered 2018-06-29: 10 mL via ORAL
  Filled 2018-06-29: qty 15

## 2018-06-29 MED ORDER — KETOROLAC TROMETHAMINE 30 MG/ML IJ SOLN
30.0000 mg | Freq: Four times a day (QID) | INTRAMUSCULAR | Status: DC | PRN
  Administered 2018-06-29 – 2018-07-01 (×4): 30 mg via INTRAVENOUS
  Filled 2018-06-29 (×4): qty 1

## 2018-06-29 MED ORDER — ONDANSETRON HCL 4 MG/2ML IJ SOLN: 4.0000 mg | Freq: Four times a day (QID) | INTRAMUSCULAR | Status: DC | PRN

## 2018-06-29 MED ORDER — SODIUM CHLORIDE 0.9 % IV SOLN
INTRAVENOUS | Status: AC
  Administered 2018-06-29: 06:00:00 via INTRAVENOUS

## 2018-06-29 MED ORDER — VANCOMYCIN HCL IN DEXTROSE 1-5 GM/200ML-% IV SOLN
1000.0000 mg | Freq: Once | INTRAVENOUS | Status: AC
  Administered 2018-06-29: 1000 mg via INTRAVENOUS
  Filled 2018-06-29: qty 200

## 2018-06-29 MED ORDER — ACETAMINOPHEN 650 MG RE SUPP: 650.0000 mg | Freq: Four times a day (QID) | RECTAL | Status: DC | PRN

## 2018-06-29 MED ORDER — KETOROLAC TROMETHAMINE 30 MG/ML IJ SOLN
30.0000 mg | Freq: Once | INTRAMUSCULAR | Status: AC
  Administered 2018-06-29: 30 mg via INTRAVENOUS
  Filled 2018-06-29: qty 1

## 2018-06-29 MED ORDER — HYDROCODONE-ACETAMINOPHEN 5-325 MG PO TABS: 1.0000 | ORAL_TABLET | ORAL | Status: DC | PRN

## 2018-06-29 MED ORDER — IOHEXOL 300 MG/ML  SOLN
75.0000 mL | Freq: Once | INTRAMUSCULAR | Status: AC | PRN
  Administered 2018-06-29: 75 mL via INTRAVENOUS

## 2018-06-29 MED ORDER — DEXAMETHASONE SODIUM PHOSPHATE 10 MG/ML IJ SOLN
10.0000 mg | Freq: Once | INTRAMUSCULAR | Status: AC
  Administered 2018-06-29: 10 mg via INTRAVENOUS
  Filled 2018-06-29: qty 1

## 2018-06-29 MED ORDER — SODIUM CHLORIDE 0.9 % IV BOLUS
1000.0000 mL | Freq: Once | INTRAVENOUS | Status: AC
  Administered 2018-06-29: 1000 mL via INTRAVENOUS

## 2018-06-29 MED ORDER — MUPIROCIN 2 % EX OINT
1.0000 "application " | TOPICAL_OINTMENT | Freq: Two times a day (BID) | CUTANEOUS | Status: DC
  Administered 2018-06-29: 1 via NASAL
  Filled 2018-06-29: qty 22

## 2018-06-29 MED ORDER — ONDANSETRON HCL 4 MG PO TABS: 4.0000 mg | ORAL_TABLET | Freq: Four times a day (QID) | ORAL | Status: DC | PRN

## 2018-06-29 MED ORDER — VANCOMYCIN HCL 10 G IV SOLR
1250.0000 mg | INTRAVENOUS | Status: DC
  Administered 2018-06-29: 1250 mg via INTRAVENOUS
  Filled 2018-06-29: qty 1250

## 2018-06-29 NOTE — ED Notes (Signed)
Pt was scanned and antibiotics were hanging but not given bc  phlebotomist was asked to come draw cultures and labs. [name] came in got labs and then I started antibiotics after labs were drawn.

## 2018-06-29 NOTE — Progress Notes (Addendum)
Patient seen and examined this morning. I reviewed admission note labs chart in detail and agree with the same with following addendum.  Briefly, 28 year old female with no significant past medical history admitted with sore throat fever, chills body ache for few days. She was seen on 3/9 in the ER with nausea vomiting diarrhea and flulike symptoms. In ER found to have epiglottitis and admitted When I saw she is in room air, resting well.Not in distress, I was able to visualize the uvula and back of throat.   Epiglottitis w tiny Single 5 mm right tonsillar/peritonsillar  abscess, Cont vancomycin, ceftriaxone.  Currently no s/o sepsis, normal lactic acid, negative procalcitonin.  Group A strep by PCR is negative.Blood culture is pending. Patient still complains of ongoing soreness difficulty with swallowing.  Start IV Decadron, continue hydration.  Cont to monitor.  Tobacco Abuse advised cessation.  I called ENT office Dr Jenne Pane and left message for call back and consult. Spoke w Dr Jenne Pane and discussed about the patient- he advised iv antibiotics and decadron for now and to call ENT if patient does not improve. If she improves can transition to clindamycin and discharge.He will f/u patient in a week as outpatient.

## 2018-06-29 NOTE — ED Provider Notes (Signed)
[facility] EMERGENCY DEPARTMENT Provider Note   CSN: [id number] Arrival date & time: 06/28/18  2236    History   Chief Complaint No chief complaint on file.   HPI [name] is a 28 y.o. female with no pertinent past medical history presents to the emergency department with a chief complaint of sore throat that began yesterday.  She reports that the pain is bilateral and radiates down into her neck and up to her bilateral ears.  She also has noted a "knot" in front of her left ear. She states that the pain is severe and is worse with eating and drinking.  She reports that her voice has become more muffled over the last day and she is feeling short of breath.  She also reports a fever with nausea, nonbilious emesis, and nonbloody diarrhea for the last 3 days.  Fever has been "100-something." She reports one episode of vomiting and one episode of diarrhea earlier today.  She reports that she has been taking Tylenol and ibuprofen every few hours for her symptoms without improvement.  She reports that she has been able to eat and drink, but has been painful.  She reports that her significant other was ill with a sore throat over the last week.  Denies trismus, hematemesis, chest pain, or rash, headache, body aches, facial swelling, neck swelling, drooling, or back pain.  No recent history of dental pain prior to onset of sore throat.  She is unsure of her immunization status.  She smokes cigarettes.  She denies vaping or smoking crack.      The history is provided by the patient. No language interpreter was used.    History reviewed. No pertinent past medical history.  Patient Active Problem List   Diagnosis Date Noted  . Epiglottitis 06/29/2018    History reviewed. No pertinent surgical history.   OB History   No obstetric history on file.      Home Medications    Prior to Admission medications   Medication Sig Start Date End Date Taking? Authorizing  Provider  chlorhexidine (PERIDEX) 0.12 % solution Use as directed 15 mLs in the mouth or throat 2 (two) times daily. Patient not taking: Reported on 06/26/2018 03/18/18   Carlyle Basques P, PA-C  naproxen (NAPROSYN) 500 MG tablet Take 1 tablet (500 mg total) by mouth 2 (two) times daily. Patient not taking: Reported on 06/26/2018 03/18/18   Carlyle Basques P, PA-C  ondansetron (ZOFRAN ODT) 4 MG disintegrating tablet Take 1 tablet (4 mg total) by mouth every 8 (eight) hours as needed for nausea or vomiting. 06/26/18   Michela Pitcher A, PA-C  oxyCODONE-acetaminophen (PERCOCET/ROXICET) 5-325 MG tablet Take 1 tablet by mouth every 6 (six) hours as needed for severe pain. Patient not taking: Reported on 06/26/2018 09/07/17   Eyvonne Mechanic, PA-C    Family History No family history on file.  Social History Social History   Tobacco Use  . Smoking status: Current Every Day Smoker    Packs/day: 1.00    Types: Cigarettes  . Smokeless tobacco: Never Used  Substance Use Topics  . Alcohol use: Not Currently  . Drug use: Not Currently     Allergies   Peanut-containing drug products   Review of Systems Review of Systems  Constitutional: Positive for chills and fever. Negative for activity change.  HENT: Positive for ear pain, sore throat, trouble swallowing and voice change. Negative for congestion, ear discharge, facial swelling, hearing loss, mouth sores, nosebleeds, rhinorrhea,  sinus pressure and sinus pain.   Respiratory: Positive for cough and shortness of breath. Negative for wheezing.   Cardiovascular: Negative for chest pain.  Gastrointestinal: Negative for abdominal pain.  Genitourinary: Negative for dysuria.  Musculoskeletal: Negative for back pain.  Skin: Negative for rash.  Allergic/Immunologic: Negative for immunocompromised state.  Neurological: Negative for headaches.  Psychiatric/Behavioral: Negative for confusion.     Physical Exam Updated Vital Signs BP 99/63   Pulse 87    Temp 99.9 F (37.7 C) (Oral)   Resp (!) 25   Ht 5\' 3"  (1.6 m)   Wt 52.2 kg   SpO2 97%   BMI 20.37 kg/m   Physical Exam Vitals signs and nursing note reviewed.  Constitutional:      General: She is not in acute distress.    Comments: Tearful  HENT:     Head: Normocephalic.     Comments: Left-sided auricular lymphadenopathy.  Bilateral cerumen impaction.  She is tolerating secretions without difficulty.    Right Ear: External ear normal. There is impacted cerumen.     Left Ear: External ear normal. There is impacted cerumen.     Nose: Nose normal.     Mouth/Throat:     Lips: Pink. Lesions present.     Mouth: Mucous membranes are moist. No injury.     Dentition: No dental tenderness.     Pharynx: Uvula midline. Posterior oropharyngeal erythema present. No pharyngeal swelling, oropharyngeal exudate or uvula swelling.     Tonsils: Swelling: 3+ on the right. 3+ on the left.  Eyes:     Extraocular Movements: Extraocular movements intact.     Conjunctiva/sclera: Conjunctivae normal.     Pupils: Pupils are equal, round, and reactive to light.  Neck:     Musculoskeletal: Normal range of motion and neck supple. Muscular tenderness present. No neck rigidity.     Comments: Diffuse bilateral anterior cervical lymphadenopathy.  She is diffusely tender to palpation to the bilateral anterior neck, but neck is supple.  No meningismus. Cardiovascular:     Rate and Rhythm: Normal rate and regular rhythm.     Heart sounds: No murmur. No friction rub. No gallop.   Pulmonary:     Effort: Pulmonary effort is normal. No respiratory distress.     Breath sounds: No stridor. No wheezing, rhonchi or rales.  Chest:     Chest wall: No tenderness.  Abdominal:     General: There is no distension.     Palpations: Abdomen is soft. There is no mass.     Tenderness: There is no abdominal tenderness. There is no right CVA tenderness, left CVA tenderness, guarding or rebound.     Hernia: No hernia is present.   Lymphadenopathy:     Cervical: Cervical adenopathy present.  Skin:    General: Skin is warm.     Findings: No rash.  Neurological:     Mental Status: She is alert.  Psychiatric:        Behavior: Behavior normal.      ED Treatments / Results  Labs (all labs ordered are listed, but only abnormal results are displayed) Labs Reviewed  BASIC METABOLIC PANEL - Abnormal; Notable for the following components:      Result Value   Sodium 133 (*)    Glucose, Bld 121 (*)    Calcium 8.3 (*)    Anion gap 3 (*)    All other components within normal limits  URINALYSIS, ROUTINE W REFLEX MICROSCOPIC - Abnormal; Notable for  the following components:   APPearance CLOUDY (*)    Hgb urine dipstick SMALL (*)    Leukocytes,Ua TRACE (*)    Bacteria, UA RARE (*)    All other components within normal limits  HEPATIC FUNCTION PANEL - Abnormal; Notable for the following components:   Albumin 3.3 (*)    All other components within normal limits  GROUP A STREP BY PCR  URINE CULTURE  CULTURE, BLOOD (ROUTINE X 2)  CULTURE, BLOOD (ROUTINE X 2)  CBC  LACTIC ACID, PLASMA  PROTIME-INR  APTT  LACTIC ACID, PLASMA  PROCALCITONIN    EKG None  Radiology Dg Chest 2 View  Result Date: 06/28/2018 CLINICAL DATA:  Nausea, vomiting, diarrhea, and chills starting today. EXAM: CHEST - 2 VIEW COMPARISON:  None. FINDINGS: Thoracolumbar scoliosis convex towards the right. Normal heart size and pulmonary vascularity. No focal airspace disease or consolidation in the lungs. No blunting of costophrenic angles. No pneumothorax. Mediastinal contours appear intact. IMPRESSION: No active cardiopulmonary disease. Electronically Signed   By: Burman Nieves M.D.   On: 06/28/2018 23:42   Ct Soft Tissue Neck W Contrast  Result Date: 06/29/2018 CLINICAL DATA:  Initial evaluation for acute sore throat, stridor. EXAM: CT NECK WITH CONTRAST TECHNIQUE: Multidetector CT imaging of the neck was performed using the standard  protocol following the bolus administration of intravenous contrast. CONTRAST:  75mL OMNIPAQUE IOHEXOL 300 MG/ML  SOLN COMPARISON:  None available. FINDINGS: Pharynx and larynx: Oral cavity within normal limits without discrete mass or loculated collection. No acute abnormality about the dentition. Palatine tonsils fairly symmetric and within normal limits. Tiny 5 mm hypodense collection at the posterior aspect of the right tonsil, suggestive of a tiny tonsillar/peritonsillar micro abscess (series 3, image 29). Adjacent parapharyngeal fat maintained. Nasopharynx within normal limits. Vallecula partially effaced by the lingual tonsils. Epiglottis is abnormally thickened and edematous in appearance, suggesting acute epiglottitis/supraglottitis (series 5, image 52). No retropharyngeal collection. Remainder of the hypopharynx and supraglottic larynx within normal limits. Glottis is closed and not well assessed. Subglottic airway clear. Salivary glands: Salivary glands including the parotid, submandibular, and sublingual glands are normal. Thyroid: Normal. Lymph nodes: No pathologically enlarged lymph nodes identified within the neck. Vascular: Normal intravascular enhancement seen throughout the neck. Limited intracranial: Unremarkable. Visualized orbits: Unremarkable. Mastoids and visualized paranasal sinuses: Tiny maxillary sinus retention cyst noted. Visualized paranasal sinuses are otherwise largely clear. Partially visualized mastoids clear as well. Skeleton: No acute osseous abnormality. No discrete lytic or blastic osseous lesions. Upper chest: Visualized upper chest demonstrates no acute finding. Partially visualized lungs are largely clear. Other: None. IMPRESSION: 1. Swollen and edematous appearance of the epiglottis, consistent with acute epiglottitis/supraglottitis. Oropharyngeal airway remains widely patent at this time. 2. Single tiny 5 mm right tonsillar/peritonsillar microabscess. Critical Value/emergent  results were called by telephone at the time of interpretation on 06/29/2018 at 4:41 am to Dr. Pedro Earls [facility] , who verbally acknowledged these results. Electronically Signed   By: Rise Mu M.D.   On: 06/29/2018 04:43    Procedures .Critical Care Performed by: Barkley Boards, PA-C Authorized by: Barkley Boards, PA-C   Critical care provider statement:    Critical care time (minutes):  45   Critical care time was exclusive of:  Separately billable procedures and treating other patients and teaching time   Critical care was necessary to treat or prevent imminent or life-threatening deterioration of the following conditions:  Respiratory failure   Critical care was time spent personally by me on  the following activities:  Ordering and review of laboratory studies, ordering and review of radiographic studies, pulse oximetry, ordering and performing treatments and interventions, re-evaluation of patient's condition, obtaining history from patient or surrogate, examination of patient, evaluation of patient's response to treatment and development of treatment plan with patient or surrogate   (including critical care time)  Medications Ordered in ED Medications  cefTRIAXone (ROCEPHIN) 2 g in sodium chloride 0.9 % 100 mL IVPB (0 g Intravenous Stopped 06/29/18 0548)  vancomycin (VANCOCIN) IVPB 1000 mg/200 mL premix (1,000 mg Intravenous New Bag/Given 06/29/18 0550)  sodium chloride flush (NS) 0.9 % injection 3 mL (has no administration in time range)  0.9 %  sodium chloride infusion ( Intravenous New Bag/Given 06/29/18 0554)  acetaminophen (TYLENOL) tablet 650 mg (has no administration in time range)    Or  acetaminophen (TYLENOL) suppository 650 mg (has no administration in time range)  ketorolac (TORADOL) 30 MG/ML injection 30 mg (has no administration in time range)  polyethylene glycol (MIRALAX / GLYCOLAX) packet 17 g (has no administration in time range)  bisacodyl (DULCOLAX) EC tablet  5 mg (has no administration in time range)  ondansetron (ZOFRAN) tablet 4 mg (has no administration in time range)    Or  ondansetron (ZOFRAN) injection 4 mg (has no administration in time range)  HYDROcodone-acetaminophen (HYCET) 7.5-325 mg/15 ml solution 10 mL (has no administration in time range)  vancomycin (VANCOCIN) 1,250 mg in sodium chloride 0.9 % 250 mL IVPB (has no administration in time range)  ketorolac (TORADOL) 30 MG/ML injection 30 mg (30 mg Intravenous Given 06/29/18 0400)  dexamethasone (DECADRON) injection 10 mg (10 mg Intravenous Given 06/29/18 0400)  sodium chloride 0.9 % bolus 1,000 mL (0 mLs Intravenous Stopped 06/29/18 0520)  iohexol (OMNIPAQUE) 300 MG/ML solution 75 mL (75 mLs Intravenous Contrast Given 06/29/18 0420)  HYDROcodone-acetaminophen (HYCET) 7.5-325 mg/15 ml solution 10 mL (10 mLs Oral Given 06/29/18 0515)  sodium chloride 0.9 % bolus 500 mL (500 mLs Intravenous New Bag/Given 06/29/18 0522)     Initial Impression / Assessment and Plan / ED Course  I have reviewed the triage vital signs and the nursing notes.  Pertinent labs & imaging results that were available during my care of the patient were reviewed by me and considered in my medical decision making (see chart for details).        28 year old female with no pertinent past medical history presenting with fever, chills, nausea, vomiting, diarrhea, sore throat, and neck pain.  On exam, the patient is tearful.  Posterior oropharynx is somewhat occluded, but she appears to have 2+ tonsils bilaterally.  She is tolerating secretions without difficulty.  No meningismus.  No tachypnea and she is not hypoxic.  Strep PCR is negative.  No leukocytosis.  UA was obtained by triage staff, but the patient is not having any urinary symptoms.  Differential diagnosis includes retropharyngeal abscess, viral pharyngitis with secondary reactive lymphadenopathy, epiglottitis, peritonsillar abscess.  She was given Toradol for pain  control and Decadron for inflammation.  Spoke with Dr. Phill Myron, radiology, regarding the patient's CT soft tissue of the neck as it is consistent with epiglottitis.  She also has a 5 mm right peritonsillar microabscess.  Oropharyngeal airway remains widely patent.  Rocephin and vancomycin have been ordered.  Will give her oral Hycet for pain control as she reports minimal improvement with her symptoms with Toradol.  Consulted the hospitalist team for admission for epiglottitis and spoke with Dr. Antionette Char who has accepted the patient  for admission. The patient appears reasonably stabilized for admission considering the current resources, flow, and capabilities available in the ED at this time, and I doubt any other [facility] requiring further screening and/or treatment in the ED prior to admission.  Final Clinical Impressions(s) / ED Diagnoses   Final diagnoses:  Epiglottitis    ED Discharge Orders    None       Barkley Boards, PA-C 06/29/18 0615    Mesner, Barbara Cower, MD 06/29/18 [phone]

## 2018-06-29 NOTE — H&P (Signed)
History and Physical    Florina Schweers JOI:[id number] DOB: 1990-08-18 DOA: 06/28/2018  PCP: Patient, No Pcp Per   Patient coming from: Home   Chief Complaint: Sore throat, fever   HPI: Jyotsna Norvell is a 28 y.o. female who denies any significant past medical history, now presenting to the emergency department for evaluation of fevers, chills, and severe throat pain.  Patient had been in her usual state of health until 06/25/2018 when she developed nausea with vomiting and diarrhea, as well as chills.  She was seen in the emergency department at that time, found to be stable for discharge home, but returned the following day with persistent vomiting and new development of sore throat.  A viral syndrome was suspected, and patient was stable for discharge home again, but has now developed worsening sore throat, severe, and persistent fevers despite taking 800 mg of Advil and 650 mg Tylenol at home. GI symptoms have improved, but she has severe sore throat, pain with swallowing, tender nodule at angle of left mandible, but no noisy breathing. Her significant other recently had sore throat.  She recently had negative influenza PCR and negative rapid strep testing.  ED Course: Upon arrival to the ED, patient is found to be febrile to 38.6 C, saturating well on room air, tachycardic in the 110s, and with stable blood pressure.  Chemistry panel features a mild hyponatremia and CBC is unremarkable.  Chest x-ray is negative for acute cardiopulmonary disease.  CT neck features a swollen epiglottis concerning for acute epiglottitis/supraglottitis with tiny 5 mm right tonsillar microabscess and widely patent oropharyngeal airway.  Patient was given a liter of normal saline, 10 mg IV Decadron, Toradol, hydrocodone, and was started on vancomycin and Rocephin.  She remains hemodynamically stable, airway intact with no appreciable dyspnea, and will be observed for ongoing evaluation and management.  Review of  Systems:  All other systems reviewed and apart from HPI, are negative.  History reviewed. No pertinent past medical history.  History reviewed. No pertinent surgical history.   reports that she has been smoking cigarettes. She has been smoking about 1.00 pack per day. She has never used smokeless tobacco. She reports previous alcohol use. She reports previous drug use.  Allergies  Allergen Reactions   Peanut-Containing Drug Products Anaphylaxis    No family history on file.   Prior to Admission medications   Medication Sig Start Date End Date Taking? Authorizing Provider  chlorhexidine (PERIDEX) 0.12 % solution Use as directed 15 mLs in the mouth or throat 2 (two) times daily. Patient not taking: Reported on 06/26/2018 03/18/18   Carlyle Basques P, PA-C  naproxen (NAPROSYN) 500 MG tablet Take 1 tablet (500 mg total) by mouth 2 (two) times daily. Patient not taking: Reported on 06/26/2018 03/18/18   Carlyle Basques P, PA-C  ondansetron (ZOFRAN ODT) 4 MG disintegrating tablet Take 1 tablet (4 mg total) by mouth every 8 (eight) hours as needed for nausea or vomiting. 06/26/18   Michela Pitcher A, PA-C  oxyCODONE-acetaminophen (PERCOCET/ROXICET) 5-325 MG tablet Take 1 tablet by mouth every 6 (six) hours as needed for severe pain. Patient not taking: Reported on 06/26/2018 09/07/17   Eyvonne Mechanic, PA-C    Physical Exam: Vitals:   06/29/18 0315 06/29/18 0400 06/29/18 0430 06/29/18 0503  BP: 113/74 122/74 114/75   Pulse: 95 (!) 102 (!) 102   Resp: (!) 47 (!) 28 (!) 31   Temp:    99.9 F (37.7 C)  TempSrc:    Oral  SpO2: 98% 98% 99%   Weight:      Height:        Constitutional: NAD, tearful  Eyes: PERTLA, lids and conjunctivae normal ENMT: Mucous membranes are moist. Posterior pharynx clear of any exudate or lesions.   Neck: supple, no thyromegaly, tender nodule at angle of mandible Lt > Rt Respiratory: clear to auscultation bilaterally, no wheezing, no crackles. Normal respiratory  effort. No stridor.   Cardiovascular: S1 & S2 heard, regular rate and rhythm. No extremity edema.   Abdomen: No distension, no tenderness, soft. Bowel sounds active.  Musculoskeletal: no clubbing / cyanosis. No joint deformity upper and lower extremities.   Skin: no significant rashes, lesions, ulcers. Warm, dry, well-perfused. Neurologic: CN 2-12 grossly intact. Sensation intact. Strength 5/5 in all 4 limbs.  Psychiatric:  Alert and oriented x 3. Tearful, cooperative.    Labs on Admission: I have personally reviewed following labs and imaging studies  CBC: Recent Labs  Lab 06/25/18 1931 06/28/18 2319  WBC 4.3 7.8  HGB 13.7 13.4  HCT 40.3 38.2  MCV 86.1 85.8  PLT 204 171   Basic Metabolic Panel: Recent Labs  Lab 06/25/18 1931 06/28/18 2319  NA 138 133*  K 3.7 3.9  CL 105 108  CO2 28 22  GLUCOSE 117* 121*  BUN 10 6  CREATININE 0.83 0.91  CALCIUM 8.7* 8.3*   GFR: Estimated Creatinine Clearance: 77.9 mL/min (by C-G formula based on SCr of 0.91 mg/dL). Liver Function Tests: Recent Labs  Lab 06/25/18 1931 06/28/18 2319  AST 24 23  ALT 11 13  ALKPHOS 53 48  BILITOT 1.2 0.7  PROT 6.5 6.5  ALBUMIN 3.6 3.3*   Recent Labs  Lab 06/25/18 1931  LIPASE 21   No results for input(s): AMMONIA in the last 168 hours. Coagulation Profile: No results for input(s): INR, PROTIME in the last 168 hours. Cardiac Enzymes: No results for input(s): CKTOTAL, CKMB, CKMBINDEX, TROPONINI in the last 168 hours. BNP (last 3 results) No results for input(s): PROBNP in the last 8760 hours. HbA1C: No results for input(s): HGBA1C in the last 72 hours. CBG: No results for input(s): GLUCAP in the last 168 hours. Lipid Profile: No results for input(s): CHOL, HDL, LDLCALC, TRIG, CHOLHDL, LDLDIRECT in the last 72 hours. Thyroid Function Tests: No results for input(s): TSH, T4TOTAL, FREET4, T3FREE, THYROIDAB in the last 72 hours. Anemia Panel: No results for input(s): VITAMINB12, FOLATE,  FERRITIN, TIBC, IRON, RETICCTPCT in the last 72 hours. Urine analysis:    Component Value Date/Time   COLORURINE YELLOW 06/28/2018 2317   APPEARANCEUR CLOUDY (A) 06/28/2018 2317   LABSPEC 1.010 06/28/2018 2317   PHURINE 5.0 06/28/2018 2317   GLUCOSEU NEGATIVE 06/28/2018 2317   HGBUR SMALL (A) 06/28/2018 2317   BILIRUBINUR NEGATIVE 06/28/2018 2317   KETONESUR NEGATIVE 06/28/2018 2317   PROTEINUR NEGATIVE 06/28/2018 2317   NITRITE NEGATIVE 06/28/2018 2317   LEUKOCYTESUR TRACE (A) 06/28/2018 2317   Sepsis Labs: @LABRCNTIP (procalcitonin:4,lacticidven:4) ) Recent Results (from the past 240 hour(s))  Urine culture     Status: Abnormal   Collection Time: 06/26/18  7:22 PM  Result Value Ref Range Status   Specimen Description URINE, RANDOM  Final   Special Requests   Final    ADDED 06/26/18 AT 2034 Performed at [facility], [address], [place], [place] [address]    Culture MULTIPLE SPECIES PRESENT, SUGGEST RECOLLECTION (A)  Final   Report Status 06/28/2018 FINAL  Final  Group A Strep by PCR  Status: None   Collection Time: 06/28/18 11:12 PM  Result Value Ref Range Status   Group A Strep by PCR NOT DETECTED NOT DETECTED Final    Comment: Performed at [facility], [address], [place], [place] [address]     Radiological Exams on Admission: Dg Chest 2 View  Result Date: 06/28/2018 CLINICAL DATA:  Nausea, vomiting, diarrhea, and chills starting today. EXAM: CHEST - 2 VIEW COMPARISON:  None. FINDINGS: Thoracolumbar scoliosis convex towards the right. Normal heart size and pulmonary vascularity. No focal airspace disease or consolidation in the lungs. No blunting of costophrenic angles. No pneumothorax. Mediastinal contours appear intact. IMPRESSION: No active cardiopulmonary disease. Electronically Signed   By: Burman Nieves M.D.   On: 06/28/2018 23:42   Ct Soft Tissue Neck W Contrast  Result Date: 06/29/2018 CLINICAL DATA:  Initial evaluation for acute sore  throat, stridor. EXAM: CT NECK WITH CONTRAST TECHNIQUE: Multidetector CT imaging of the neck was performed using the standard protocol following the bolus administration of intravenous contrast. CONTRAST:  75mL OMNIPAQUE IOHEXOL 300 MG/ML  SOLN COMPARISON:  None available. FINDINGS: Pharynx and larynx: Oral cavity within normal limits without discrete mass or loculated collection. No acute abnormality about the dentition. Palatine tonsils fairly symmetric and within normal limits. Tiny 5 mm hypodense collection at the posterior aspect of the right tonsil, suggestive of a tiny tonsillar/peritonsillar micro abscess (series 3, image 29). Adjacent parapharyngeal fat maintained. Nasopharynx within normal limits. Vallecula partially effaced by the lingual tonsils. Epiglottis is abnormally thickened and edematous in appearance, suggesting acute epiglottitis/supraglottitis (series 5, image 52). No retropharyngeal collection. Remainder of the hypopharynx and supraglottic larynx within normal limits. Glottis is closed and not well assessed. Subglottic airway clear. Salivary glands: Salivary glands including the parotid, submandibular, and sublingual glands are normal. Thyroid: Normal. Lymph nodes: No pathologically enlarged lymph nodes identified within the neck. Vascular: Normal intravascular enhancement seen throughout the neck. Limited intracranial: Unremarkable. Visualized orbits: Unremarkable. Mastoids and visualized paranasal sinuses: Tiny maxillary sinus retention cyst noted. Visualized paranasal sinuses are otherwise largely clear. Partially visualized mastoids clear as well. Skeleton: No acute osseous abnormality. No discrete lytic or blastic osseous lesions. Upper chest: Visualized upper chest demonstrates no acute finding. Partially visualized lungs are largely clear. Other: None. IMPRESSION: 1. Swollen and edematous appearance of the epiglottis, consistent with acute epiglottitis/supraglottitis. Oropharyngeal  airway remains widely patent at this time. 2. Single tiny 5 mm right tonsillar/peritonsillar microabscess. Critical Value/emergent results were called by telephone at the time of interpretation on 06/29/2018 at 4:41 am to Dr. Pedro Earls [facility] , who verbally acknowledged these results. Electronically Signed   By: Rise Mu M.D.   On: 06/29/2018 04:43    EKG: Not performed.   Assessment/Plan  1. Epiglottitis  - Presents with fevers and sore throat, found to be febrile with tachycardia  - CT neck concerning for acute epiglottitis with tiny right tonsillar abscess and widely patent airway  - She was treated with IVF and analgesia and antibiotics were ordered  - Culture blood, check lactate, give additional IVF, continue empiric vancomycin and Rocephin, check procalcitonin, droplet precautions for now, close monitoring of airway in SDU for now    DVT prophylaxis: SCD's  Code Status: Full  Family Communication: Discussed with patient  Consults called: None  Admission status: Observation    Briscoe Deutscher, MD [facility] Pager [phone]  If 7PM-7AM, please contact night-coverage [email or web address] Password [facility]  06/29/2018, 5:18 AM

## 2018-06-29 NOTE — ED Notes (Signed)
ED TO INPATIENT HANDOFF REPORT  ED Nurse Name and Phone #:  Sabino Gasser [phone]  S Name/Age/Gender [name] 28 y.o. female Room/Bed: 018C/018C  Code Status   Code Status: Full Code  Home/SNF/Other Home Patient oriented to: self, place, time and situation Is this baseline? Yes   Triage Complete: Triage complete  Chief Complaint HIGH FEVER AND SORE THROAT  Triage Note Pt presents with sore throat, fever/chills, body aches X few days. States she was seen here 06/25/2018 for N/V/D, flu like S/S but started to develop sore throat after being seen. Pt took 800 Advil and 650 Tylenol PTA, states she has been unable to control her fevers. Pt tearful in triage.    Allergies Allergies  Allergen Reactions  . Peanut-Containing Drug Products Anaphylaxis    Level of Care/Admitting Diagnosis ED Disposition    ED Disposition Condition Comment   Admit  Hospital Area: [facility] [[id number]]  Level of Care: Progressive [102]  I expect the patient will be discharged within 24 hours: No (not a candidate for 5C-Observation unit)  Diagnosis: Epiglottitis [454098]  Admitting Physician: Briscoe Deutscher [[id number]]  Attending Physician: Briscoe Deutscher [[id number]]  PT Class (Do Not Modify): Observation [104]  PT Acc Code (Do Not Modify): Observation [10022]       B Medical/Surgery History History reviewed. No pertinent past medical history. History reviewed. No pertinent surgical history.   A IV Location/Drains/Wounds Patient Lines/Drains/Airways Status   Active Line/Drains/Airways    Name:   Placement date:   Placement time:   Site:   Days:   Peripheral IV 06/29/18 Left Forearm   06/29/18    0250    Forearm   less than 1          Intake/Output Last 24 hours  Intake/Output Summary (Last 24 hours) at 06/29/2018 1121 Last data filed at 06/29/2018 0865 Gross per 24 hour  Intake 1173.59 ml  Output -  Net 1173.59 ml    Labs/Imaging Results for orders placed or  performed during the hospital encounter of 06/28/18 (from the past 48 hour(s))  Group A Strep by PCR     Status: None   Collection Time: 06/28/18 11:12 PM  Result Value Ref Range   Group A Strep by PCR NOT DETECTED NOT DETECTED    Comment: Performed at [facility], [address], [place], [place] [address]  Urinalysis, Routine w reflex microscopic     Status: Abnormal   Collection Time: 06/28/18 11:17 PM  Result Value Ref Range   Color, Urine YELLOW YELLOW   APPearance CLOUDY (A) CLEAR   Specific Gravity, Urine 1.010 1.005 - 1.030   pH 5.0 5.0 - 8.0   Glucose, UA NEGATIVE NEGATIVE mg/dL   Hgb urine dipstick SMALL (A) NEGATIVE   Bilirubin Urine NEGATIVE NEGATIVE   Ketones, ur NEGATIVE NEGATIVE mg/dL   Protein, ur NEGATIVE NEGATIVE mg/dL   Nitrite NEGATIVE NEGATIVE   Leukocytes,Ua TRACE (A) NEGATIVE   RBC / HPF 0-5 0 - 5 RBC/hpf   WBC, UA 0-5 0 - 5 WBC/hpf   Bacteria, UA RARE (A) NONE SEEN   Squamous Epithelial / LPF 6-10 0 - 5    Comment: Performed at [facility], [address], [place], [place] [address]  CBC     Status: None   Collection Time: 06/28/18 11:19 PM  Result Value Ref Range   WBC 7.8 4.0 - 10.5 K/uL   RBC 4.45 3.87 - 5.11 MIL/uL  Hemoglobin 13.4 12.0 - 15.0 g/dL   HCT 75.0 51.8 - 33.5 %   MCV 85.8 80.0 - 100.0 fL   MCH 30.1 26.0 - 34.0 pg   MCHC 35.1 30.0 - 36.0 g/dL   RDW 82.5 18.9 - 84.2 %   Platelets 171 150 - 400 K/uL   nRBC 0.0 0.0 - 0.2 %    Comment: Performed at [facility], [address], [place], [place] [address]  Basic metabolic panel     Status: Abnormal   Collection Time: 06/28/18 11:19 PM  Result Value Ref Range   Sodium 133 (L) 135 - 145 mmol/L   Potassium 3.9 3.5 - 5.1 mmol/L   Chloride 108 98 - 111 mmol/L   CO2 22 22 - 32 mmol/L   Glucose, Bld 121 (H) 70 - 99 mg/dL   BUN 6 6 - 20 mg/dL   Creatinine, Ser 8.11 0.44 - 1.00 mg/dL   Calcium 8.3 (L) 8.9 - 10.3 mg/dL   GFR calc non Af Amer >60 >60 mL/min   GFR calc Af  Amer >60 >60 mL/min   Anion gap 3 (L) 5 - 15    Comment: Performed at [facility], [address], [place], [place] [address]  Hepatic function panel     Status: Abnormal   Collection Time: 06/28/18 11:19 PM  Result Value Ref Range   Total Protein 6.5 6.5 - 8.1 g/dL   Albumin 3.3 (L) 3.5 - 5.0 g/dL   AST 23 15 - 41 U/L   ALT 13 0 - 44 U/L   Alkaline Phosphatase 48 38 - 126 U/L   Total Bilirubin 0.7 0.3 - 1.2 mg/dL   Bilirubin, Direct 0.2 0.0 - 0.2 mg/dL   Indirect Bilirubin 0.5 0.3 - 0.9 mg/dL    Comment: Performed at [facility], [address] [place], [place] [address]  Lactic acid, plasma     Status: None   Collection Time: 06/29/18  5:20 AM  Result Value Ref Range   Lactic Acid, Venous 0.7 0.5 - 1.9 mmol/L    Comment: Performed at [facility], [address] [place], [place] [address]  Protime-INR     Status: None   Collection Time: 06/29/18  5:20 AM  Result Value Ref Range   Prothrombin Time 14.4 11.4 - 15.2 seconds   INR 1.1 0.8 - 1.2    Comment: (NOTE) INR goal varies based on device and disease states. Performed at [facility], [address], [place], [place] [address]   APTT     Status: None   Collection Time: 06/29/18  5:20 AM  Result Value Ref Range   aPTT 32 24 - 36 seconds    Comment: Performed at [facility], [address], [place], [place] [address]  Procalcitonin     Status: None   Collection Time: 06/29/18  5:20 AM  Result Value Ref Range   Procalcitonin 0.23 ng/mL    Comment:        Interpretation: PCT (Procalcitonin) <= 0.5 ng/mL: Systemic infection (sepsis) is not likely. Local bacterial infection is possible. (NOTE)       Sepsis PCT Algorithm           Lower Respiratory Tract  Infection PCT Algorithm    ----------------------------     ----------------------------         PCT < 0.25 ng/mL                PCT < 0.10 ng/mL         Strongly encourage             Strongly  discourage   discontinuation of antibiotics    initiation of antibiotics    ----------------------------     -----------------------------       PCT 0.25 - 0.50 ng/mL            PCT 0.10 - 0.25 ng/mL               OR       >80% decrease in PCT            Discourage initiation of                                            antibiotics      Encourage discontinuation           of antibiotics    ----------------------------     -----------------------------         PCT >= 0.50 ng/mL              PCT 0.26 - 0.50 ng/mL               AND        <80% decrease in PCT             Encourage initiation of                                             antibiotics       Encourage continuation           of antibiotics    ----------------------------     -----------------------------        PCT >= 0.50 ng/mL                  PCT > 0.50 ng/mL               AND         increase in PCT                  Strongly encourage                                      initiation of antibiotics    Strongly encourage escalation           of antibiotics                                     -----------------------------                                           PCT <= 0.25 ng/mL  OR                                        > 80% decrease in PCT                                     Discontinue / Do not initiate                                             antibiotics Performed at [facility], [address], [place], [place] [address]   Lactic acid, plasma     Status: None   Collection Time: 06/29/18  6:31 AM  Result Value Ref Range   Lactic Acid, Venous 0.9 0.5 - 1.9 mmol/L    Comment: Performed at [facility], [address] [place], [place] [address]   Dg Chest 2 View  Result Date: 06/28/2018 CLINICAL DATA:  Nausea, vomiting, diarrhea, and chills starting today. EXAM: CHEST - 2 VIEW COMPARISON:  None. FINDINGS: Thoracolumbar scoliosis convex towards the  right. Normal heart size and pulmonary vascularity. No focal airspace disease or consolidation in the lungs. No blunting of costophrenic angles. No pneumothorax. Mediastinal contours appear intact. IMPRESSION: No active cardiopulmonary disease. Electronically Signed   By: Burman Nieves M.D.   On: 06/28/2018 23:42   Ct Soft Tissue Neck W Contrast  Result Date: 06/29/2018 CLINICAL DATA:  Initial evaluation for acute sore throat, stridor. EXAM: CT NECK WITH CONTRAST TECHNIQUE: Multidetector CT imaging of the neck was performed using the standard protocol following the bolus administration of intravenous contrast. CONTRAST:  75mL OMNIPAQUE IOHEXOL 300 MG/ML  SOLN COMPARISON:  None available. FINDINGS: Pharynx and larynx: Oral cavity within normal limits without discrete mass or loculated collection. No acute abnormality about the dentition. Palatine tonsils fairly symmetric and within normal limits. Tiny 5 mm hypodense collection at the posterior aspect of the right tonsil, suggestive of a tiny tonsillar/peritonsillar micro abscess (series 3, image 29). Adjacent parapharyngeal fat maintained. Nasopharynx within normal limits. Vallecula partially effaced by the lingual tonsils. Epiglottis is abnormally thickened and edematous in appearance, suggesting acute epiglottitis/supraglottitis (series 5, image 52). No retropharyngeal collection. Remainder of the hypopharynx and supraglottic larynx within normal limits. Glottis is closed and not well assessed. Subglottic airway clear. Salivary glands: Salivary glands including the parotid, submandibular, and sublingual glands are normal. Thyroid: Normal. Lymph nodes: No pathologically enlarged lymph nodes identified within the neck. Vascular: Normal intravascular enhancement seen throughout the neck. Limited intracranial: Unremarkable. Visualized orbits: Unremarkable. Mastoids and visualized paranasal sinuses: Tiny maxillary sinus retention cyst noted. Visualized paranasal  sinuses are otherwise largely clear. Partially visualized mastoids clear as well. Skeleton: No acute osseous abnormality. No discrete lytic or blastic osseous lesions. Upper chest: Visualized upper chest demonstrates no acute finding. Partially visualized lungs are largely clear. Other: None. IMPRESSION: 1. Swollen and edematous appearance of the epiglottis, consistent with acute epiglottitis/supraglottitis. Oropharyngeal airway remains widely patent at this time. 2. Single tiny 5 mm right tonsillar/peritonsillar microabscess. Critical Value/emergent results were called by telephone at the time of interpretation on 06/29/2018 at 4:41 am to Dr. Pedro Earls [facility] , who verbally acknowledged these results. Electronically Signed   By: Rise Mu  M.D.   On: 06/29/2018 04:43    Pending Labs Unresulted Labs (From admission, onward)    Start     Ordered   06/30/18 0500  HIV antibody (Routine Testing)  Tomorrow morning,   R     06/29/18 0516   06/30/18 0500  Basic metabolic panel  Tomorrow morning,   R     06/29/18 0516   06/30/18 0500  CBC WITH DIFFERENTIAL  Tomorrow morning,   R     06/29/18 0516   06/29/18 0508  Culture, blood (x 2)  BLOOD CULTURE X 2,   STAT    Comments:  INITIATE ANTIBIOTICS WITHIN 1 HOUR AFTER BLOOD CULTURES DRAWN.  If unable to obtain blood cultures, call MD immediately regarding antibiotic instructions.    06/29/18 0508   06/29/18 0300  Urine culture  Add-on,   STAT     06/29/18 0300          Vitals/Pain Today's Vitals   06/29/18 0824 06/29/18 0829 06/29/18 0915 06/29/18 1000  BP:   119/85 123/79  Pulse:   78 81  Resp:   (!) 29 (!) 23  Temp:  97.9 F (36.6 C)    TempSrc:  Oral    SpO2:   99% 100%  Weight:      Height:      PainSc: 8        Isolation Precautions Droplet precaution  Medications Medications  cefTRIAXone (ROCEPHIN) 2 g in sodium chloride 0.9 % 100 mL IVPB (0 g Intravenous Stopped 06/29/18 0548)  sodium chloride flush (NS) 0.9 % injection 3  mL (3 mLs Intravenous Not Given 06/29/18 0932)  0.9 %  sodium chloride infusion ( Intravenous New Bag/Given 06/29/18 0554)  acetaminophen (TYLENOL) tablet 650 mg (has no administration in time range)    Or  acetaminophen (TYLENOL) suppository 650 mg (has no administration in time range)  ketorolac (TORADOL) 30 MG/ML injection 30 mg (30 mg Intravenous Given 06/29/18 0825)  polyethylene glycol (MIRALAX / GLYCOLAX) packet 17 g (has no administration in time range)  bisacodyl (DULCOLAX) EC tablet 5 mg (has no administration in time range)  ondansetron (ZOFRAN) tablet 4 mg (has no administration in time range)    Or  ondansetron (ZOFRAN) injection 4 mg (has no administration in time range)  HYDROcodone-acetaminophen (HYCET) 7.5-325 mg/15 ml solution 10 mL (has no administration in time range)  vancomycin (VANCOCIN) 1,250 mg in sodium chloride 0.9 % 250 mL IVPB (has no administration in time range)  ketorolac (TORADOL) 30 MG/ML injection 30 mg (30 mg Intravenous Given 06/29/18 0400)  dexamethasone (DECADRON) injection 10 mg (10 mg Intravenous Given 06/29/18 0400)  sodium chloride 0.9 % bolus 1,000 mL (0 mLs Intravenous Stopped 06/29/18 0520)  iohexol (OMNIPAQUE) 300 MG/ML solution 75 mL (75 mLs Intravenous Contrast Given 06/29/18 0420)  HYDROcodone-acetaminophen (HYCET) 7.5-325 mg/15 ml solution 10 mL (10 mLs Oral Given 06/29/18 0515)  vancomycin (VANCOCIN) IVPB 1000 mg/200 mL premix (0 mg Intravenous Stopped 06/29/18 0657)  sodium chloride 0.9 % bolus 500 mL (500 mLs Intravenous New Bag/Given 06/29/18 0522)    Mobility walks with person assist     Focused Assessments Respiratory   R Recommendations: See Admitting Provider Note  Report given to:   Additional Notes:

## 2018-06-29 NOTE — ED Triage Notes (Signed)
Pt presents with sore throat, fever/chills, body aches X few days. States she was seen here 06/25/2018 for N/V/D, flu like S/S but started to develop sore throat after being seen. Pt took 800 Advil and 650 Tylenol PTA, states she has been unable to control her fevers. Pt tearful in triage.

## 2018-06-29 NOTE — Progress Notes (Signed)
Pharmacy Antibiotic Note  [name] is a 28 y.o. female admitted on 06/28/2018 with epiglottitis .  Pharmacy has been consulted for Vancomycin dosing. WBC WNL. Renal function good.   Plan: Vancomycin 1250 mg IV q24h >>Estimated AUC: 481 Ceftriaxone per MD Trend WBC, temp, renal function  F/U infectious work-up Drug levels as indicated   Height: 5\' 3"  (160 cm) Weight: 115 lb (52.2 kg) IBW/kg (Calculated) : 52.4  Temp (24hrs), Avg:100.8 F (38.2 C), Min:99.9 F (37.7 C), Max:101.4 F (38.6 C)  Recent Labs  Lab 06/25/18 1931 06/28/18 2319 06/29/18 0520  WBC 4.3 7.8  --   CREATININE 0.83 0.91  --   LATICACIDVEN  --   --  0.7    Estimated Creatinine Clearance: 77.9 mL/min (by C-G formula based on SCr of 0.91 mg/dL).    Allergies  Allergen Reactions  . Peanut-Containing Drug Products Anaphylaxis    Abran Duke 06/29/2018 5:57 AM

## 2018-06-30 DIAGNOSIS — J36 Peritonsillar abscess: Secondary | ICD-10-CM | POA: Diagnosis present

## 2018-06-30 LAB — BASIC METABOLIC PANEL
Anion gap: 9 (ref 5–15)
BUN: 8 mg/dL (ref 6–20)
CALCIUM: 9.2 mg/dL (ref 8.9–10.3)
CO2: 22 mmol/L (ref 22–32)
Chloride: 109 mmol/L (ref 98–111)
Creatinine, Ser: 0.72 mg/dL (ref 0.44–1.00)
GFR calc Af Amer: >60 mL/min (ref >60)
GFR calc non Af Amer: >60 mL/min (ref >60)
Glucose, Bld: 117 mg/dL — ABNORMAL HIGH (ref 70–99)
Potassium: 4.7 mmol/L (ref 3.5–5.1)
SODIUM: 140 mmol/L (ref 135–145)

## 2018-06-30 LAB — CBC WITH DIFFERENTIAL/PLATELET
Abs Immature Granulocytes: 0.05 10*3/uL (ref 0.00–0.07)
Basophils Absolute: 0 10*3/uL (ref 0.0–0.1)
Basophils Relative: 0 %
EOS ABS: 0 10*3/uL (ref 0.0–0.5)
Eosinophils Relative: 0 %
HCT: 36.7 % (ref 36.0–46.0)
Hemoglobin: 12.2 g/dL (ref 12.0–15.0)
Immature Granulocytes: 1 %
Lymphocytes Relative: 13 %
Lymphs Abs: 1.1 10*3/uL (ref 0.7–4.0)
MCH: 28.8 pg (ref 26.0–34.0)
MCHC: 33.2 g/dL (ref 30.0–36.0)
MCV: 86.6 fL (ref 80.0–100.0)
Monocytes Absolute: 0.2 10*3/uL (ref 0.1–1.0)
Monocytes Relative: 3 %
Neutro Abs: 7.2 10*3/uL (ref 1.7–7.7)
Neutrophils Relative %: 83 %
Platelets: 180 10*3/uL (ref 150–400)
RBC: 4.24 MIL/uL (ref 3.87–5.11)
RDW: 12.9 % (ref 11.5–15.5)
WBC: 8.7 10*3/uL (ref 4.0–10.5)
nRBC: 0 % (ref 0.0–0.2)

## 2018-06-30 LAB — HIV ANTIBODY (ROUTINE TESTING W REFLEX): HIV Screen 4th Generation wRfx: NONREACTIVE

## 2018-06-30 MED ORDER — DIPHENHYDRAMINE HCL 50 MG/ML IJ SOLN
25.0000 mg | Freq: Once | INTRAMUSCULAR | Status: AC
  Administered 2018-06-30: 25 mg via INTRAVENOUS
  Filled 2018-06-30: qty 1

## 2018-06-30 MED ORDER — CLINDAMYCIN PHOSPHATE 600 MG/50ML IV SOLN
600.0000 mg | Freq: Three times a day (TID) | INTRAVENOUS | Status: DC
  Administered 2018-06-30 – 2018-07-01 (×4): 600 mg via INTRAVENOUS
  Filled 2018-06-30 (×4): qty 50

## 2018-06-30 NOTE — Plan of Care (Signed)
  Problem: Education: Goal: Knowledge of General Education information will improve Description Including pain rating scale, medication(s)/side effects and non-pharmacologic comfort measures Outcome: Progressing Note:  POC reviewed with pt.   

## 2018-06-30 NOTE — Progress Notes (Signed)
Pharmacy Antibiotic Note  [name] is a 29 y.o. female admitted on 06/28/2018 with peritonsillar abscess/epiglottitis.  Pharmacy has been consulted for clindamycin dosing. Patient currently afebrile, WBC 8.7, and Scr stable at 0.72.   Plan: Start clindamycin 600 mg IV q8h Monitor CBC, Scr, and clinical progression F/u plans for LOT  Height: 5\' 3"  (160 cm) Weight: 111 lb 3.2 oz (50.4 kg) IBW/kg (Calculated) : 52.4  Temp (24hrs), Avg:98 F (36.7 C), Min:97.5 F (36.4 C), Max:98.9 F (37.2 C)  Recent Labs  Lab 06/25/18 1931 06/28/18 2319 06/29/18 0520 06/29/18 0631 06/30/18 0528  WBC 4.3 7.8  --   --  8.7  CREATININE 0.83 0.91  --   --  0.72  LATICACIDVEN  --   --  0.7 0.9  --     Estimated Creatinine Clearance: 85.5 mL/min (by C-G formula based on SCr of 0.72 mg/dL).    Allergies  Allergen Reactions  . Peanut-Containing Drug Products Anaphylaxis    Antimicrobials this admission: Clindamycin 3/14 >>  Ceftriaxone 3/13 >> 3/14 Vancomycin 3/13 >>3/14   Dose adjustments this admission: N/A  Microbiology results: 3/13 BCx:  UCx:   Group A Strep PCR: not detected 3/13 MRSA PCR: negative  Thank you for allowing pharmacy to be a part of this patient's care.  Lenord Carbo, PharmD PGY1 Pharmacy Resident Phone: [phone]  Please check AMION for all [facility] phone numbers 06/30/2018 10:05 AM

## 2018-06-30 NOTE — Progress Notes (Signed)
PROGRESS NOTE    [name]  PRX:[id number] DOB: [birth year] DOA: 06/28/2018 PCP: Patient, No Pcp Per   Brief Narrative: 28 year old female with no significant past medical history admitted with sore throat fever, chills body ache for few days. She was seen on 3/9 in the ER with nausea vomiting diarrhea and flulike symptoms.  Subjective: Throat pain is improving. Afebrile overnight. Tolerating liquid diet  Assessment & Plan:  Epiglottitis w tiny Single 5 mm right tonsillar/peritonsillar  abscess: clinically improving. Afebrile. Pain is improving and tolerating liquid diet, no airway compromise. We will change antibiotics to Clindamycin- pharmacy to dose. Cont iv decadron. Cont antibiotics for additional 24 hr and reassess. I had discussed w Dr Jenne Pane from ENT regarding the plan. He will f/u as outpatient  Tobacco Abuse advised cessation.  DVT prophylaxis: ambulate Code Status: full code Family Communication: no family at bedside Disposition Plan: change to inpatient as patient requires iv antibiotics and management as above.  Consultants:  ENT  Procedures: NONE  Antimicrobials: Anti-infectives (From admission, onward)   Start     Dose/Rate Route Frequency Ordered Stop   06/30/18 1030  clindamycin (CLEOCIN) IVPB 600 mg     600 mg 100 mL/hr over 30 Minutes Intravenous Every 8 hours 06/30/18 1015     06/29/18 2200  vancomycin (VANCOCIN) 1,250 mg in sodium chloride 0.9 % 250 mL IVPB  Status:  Discontinued     1,250 mg 166.7 mL/hr over 90 Minutes Intravenous Every 24 hours 06/29/18 0602 06/30/18 1000   06/29/18 0515  vancomycin (VANCOCIN) IVPB 1000 mg/200 mL premix     1,000 mg 200 mL/hr over 60 Minutes Intravenous  Once 06/29/18 0501 06/29/18 0657   06/29/18 0500  cefTRIAXone (ROCEPHIN) 2 g in sodium chloride 0.9 % 100 mL IVPB  Status:  Discontinued     2 g 200 mL/hr over 30 Minutes Intravenous Every 24 hours 06/29/18 0445 06/30/18 1000       Objective: Vitals:   06/29/18 1130 06/29/18 1227 06/30/18 0439 06/30/18 0608  BP: 95/61 127/72  101/79  Pulse: 64 (!) 57    Resp: 19 14    Temp:  98.9 F (37.2 C) (!) 97.5 F (36.4 C) (!) 97.5 F (36.4 C)  TempSrc:  Oral  Oral  SpO2: 99% 100%  99%  Weight:  50.4 kg    Height:        Intake/Output Summary (Last 24 hours) at 06/30/2018 1117 Last data filed at 06/30/2018 0658 Gross per 24 hour  Intake 1203 ml  Output --  Net 1203 ml   Filed Weights   06/28/18 2311 06/29/18 1227  Weight: 52.2 kg 50.4 kg   Weight change: -1.724 kg  Body mass index is 19.7 kg/m.  Intake/Output from previous day: 03/13 0701 - 03/14 0700 In: 1203 [I.V.:1103; IV Piggyback:100] Out: -  Intake/Output this shift: No intake/output data recorded.  Examination:  General exam: Appears calm and comfortable, appears ill,  HEENT:PERRL,Oral mucosa moist, Ear/Nose normal on gross exam. Tender on submandibular area. Respiratory system: Bilateral equal air entry, normal vesicular breath sounds, no wheezes or crackles.  Cardiovascular system: S1 & S2 heard,No JVD, murmurs. Gastrointestinal system: Abdomen is  soft, non tender, non distended, BS +  Nervous System:Alert and oriented. No focal neurological deficits/moving extremities, sensation intact. Extremities: No edema, no clubbing, distal peripheral pulses palpable. Skin: No rashes, lesions, no icterus MSK: Normal muscle bulk,tone ,power  Medications:  Scheduled Meds:  dexamethasone  4 mg Intravenous Q6H   mupirocin ointment  1 application Nasal BID   sodium chloride flush  3 mL Intravenous Q12H   Continuous Infusions:  sodium chloride 100 mL/hr at 06/30/18 0158   clindamycin (CLEOCIN) IV 600 mg (06/30/18 1030)    Data Reviewed: I have personally reviewed following labs and imaging studies  CBC: Recent Labs  Lab 06/25/18 1931 06/28/18 2319 06/30/18 0528  WBC 4.3 7.8 8.7  NEUTROABS  --   --  7.2  HGB 13.7 13.4 12.2  HCT 40.3 38.2 36.7  MCV 86.1 85.8  86.6  PLT 204 171 180   Basic Metabolic Panel: Recent Labs  Lab 06/25/18 1931 06/28/18 2319 06/30/18 0528  NA 138 133* 140  K 3.7 3.9 4.7  CL 105 108 109  CO2 28 22 22   GLUCOSE 117* 121* 117*  BUN 10 6 8   CREATININE 0.83 0.91 0.72  CALCIUM 8.7* 8.3* 9.2   GFR: Estimated Creatinine Clearance: 85.5 mL/min (by C-G formula based on SCr of 0.72 mg/dL). Liver Function Tests: Recent Labs  Lab 06/25/18 1931 06/28/18 2319  AST 24 23  ALT 11 13  ALKPHOS 53 48  BILITOT 1.2 0.7  PROT 6.5 6.5  ALBUMIN 3.6 3.3*   Recent Labs  Lab 06/25/18 1931  LIPASE 21   No results for input(s): AMMONIA in the last 168 hours. Coagulation Profile: Recent Labs  Lab 06/29/18 0520  INR 1.1   Cardiac Enzymes: No results for input(s): CKTOTAL, CKMB, CKMBINDEX, TROPONINI in the last 168 hours. BNP (last 3 results) No results for input(s): PROBNP in the last 8760 hours. HbA1C: No results for input(s): HGBA1C in the last 72 hours. CBG: No results for input(s): GLUCAP in the last 168 hours. Lipid Profile: No results for input(s): CHOL, HDL, LDLCALC, TRIG, CHOLHDL, LDLDIRECT in the last 72 hours. Thyroid Function Tests: No results for input(s): TSH, T4TOTAL, FREET4, T3FREE, THYROIDAB in the last 72 hours. Anemia Panel: No results for input(s): VITAMINB12, FOLATE, FERRITIN, TIBC, IRON, RETICCTPCT in the last 72 hours. Sepsis Labs: Recent Labs  Lab 06/29/18 0520 06/29/18 0631  PROCALCITON 0.23  --   LATICACIDVEN 0.7 0.9    Recent Results (from the past 240 hour(s))  Urine culture     Status: Abnormal   Collection Time: 06/26/18  7:22 PM  Result Value Ref Range Status   Specimen Description URINE, RANDOM  Final   Special Requests   Final    ADDED 06/26/18 AT 2034 Performed at [facility] Lab, [address] [place], [place] [address]    Culture MULTIPLE SPECIES PRESENT, SUGGEST RECOLLECTION (A)  Final   Report Status 06/28/2018 FINAL  Final  Group A Strep by PCR     Status: None    Collection Time: 06/28/18 11:12 PM  Result Value Ref Range Status   Group A Strep by PCR NOT DETECTED NOT DETECTED Final    Comment: Performed at [facility] Lab, [address] [place], [place] [address]  Surgical PCR screen     Status: None   Collection Time: 06/29/18  6:05 PM  Result Value Ref Range Status   MRSA, PCR NEGATIVE NEGATIVE Final   Staphylococcus aureus NEGATIVE NEGATIVE Final    Comment: (NOTE) The Xpert SA Assay (FDA approved for NASAL specimens in patients 59 years of age and older), is one component of a comprehensive surveillance program. It is not intended to diagnose infection nor to guide or monitor treatment. Performed at [facility] Lab, [address], [place], [place] [address]  Radiology Studies: Dg Chest 2 View  Result Date: 06/28/2018 CLINICAL DATA:  Nausea, vomiting, diarrhea, and chills starting today. EXAM: CHEST - 2 VIEW COMPARISON:  None. FINDINGS: Thoracolumbar scoliosis convex towards the right. Normal heart size and pulmonary vascularity. No focal airspace disease or consolidation in the lungs. No blunting of costophrenic angles. No pneumothorax. Mediastinal contours appear intact. IMPRESSION: No active cardiopulmonary disease. Electronically Signed   By: Burman Nieves M.D.   On: 06/28/2018 23:42   Ct Soft Tissue Neck W Contrast  Result Date: 06/29/2018 CLINICAL DATA:  Initial evaluation for acute sore throat, stridor. EXAM: CT NECK WITH CONTRAST TECHNIQUE: Multidetector CT imaging of the neck was performed using the standard protocol following the bolus administration of intravenous contrast. CONTRAST:  75mL OMNIPAQUE IOHEXOL 300 MG/ML  SOLN COMPARISON:  None available. FINDINGS: Pharynx and larynx: Oral cavity within normal limits without discrete mass or loculated collection. No acute abnormality about the dentition. Palatine tonsils fairly symmetric and within normal limits. Tiny 5 mm hypodense collection at the posterior aspect  of the right tonsil, suggestive of a tiny tonsillar/peritonsillar micro abscess (series 3, image 29). Adjacent parapharyngeal fat maintained. Nasopharynx within normal limits. Vallecula partially effaced by the lingual tonsils. Epiglottis is abnormally thickened and edematous in appearance, suggesting acute epiglottitis/supraglottitis (series 5, image 52). No retropharyngeal collection. Remainder of the hypopharynx and supraglottic larynx within normal limits. Glottis is closed and not well assessed. Subglottic airway clear. Salivary glands: Salivary glands including the parotid, submandibular, and sublingual glands are normal. Thyroid: Normal. Lymph nodes: No pathologically enlarged lymph nodes identified within the neck. Vascular: Normal intravascular enhancement seen throughout the neck. Limited intracranial: Unremarkable. Visualized orbits: Unremarkable. Mastoids and visualized paranasal sinuses: Tiny maxillary sinus retention cyst noted. Visualized paranasal sinuses are otherwise largely clear. Partially visualized mastoids clear as well. Skeleton: No acute osseous abnormality. No discrete lytic or blastic osseous lesions. Upper chest: Visualized upper chest demonstrates no acute finding. Partially visualized lungs are largely clear. Other: None. IMPRESSION: 1. Swollen and edematous appearance of the epiglottis, consistent with acute epiglottitis/supraglottitis. Oropharyngeal airway remains widely patent at this time. 2. Single tiny 5 mm right tonsillar/peritonsillar microabscess. Critical Value/emergent results were called by telephone at the time of interpretation on 06/29/2018 at 4:41 am to Dr. Pedro [facility]ls [facility] , who verbally acknowledged these results. Electronically Signed   By: Rise Mu M.D.   On: 06/29/2018 04:43      LOS: 0 days   Time spent: More than 50% of that time was spent in counseling and/or coordination of care.  Lanae Boast, MD [facility]  06/30/2018, 11:17 AM

## 2018-07-01 MED ORDER — METHYLPREDNISOLONE 4 MG PO TBPK: ORAL_TABLET | ORAL | 0 refills | Status: DC

## 2018-07-01 MED ORDER — DIPHENHYDRAMINE HCL 25 MG PO CAPS: 25.0000 mg | ORAL_CAPSULE | Freq: Four times a day (QID) | ORAL | 0 refills | Status: DC | PRN

## 2018-07-01 MED ORDER — CLINDAMYCIN HCL 300 MG PO CAPS: 600.0000 mg | ORAL_CAPSULE | Freq: Three times a day (TID) | ORAL | 0 refills | Status: AC

## 2018-07-01 NOTE — Discharge Summary (Signed)
Physician Discharge Summary  Cateria Fleegle BMS:[id number] DOB: 01-25-1991 DOA: 06/28/2018  PCP: Patient, No Pcp Per  Admit date: 06/28/2018 Discharge date: 07/01/2018  Admitted From: home Disposition:  home  Recommendations for Outpatient Follow-up:  1. Follow up with ENT in 1 week-please call office  Home Health:no  Equipment/Devices:none Discharge Condition:stable  CODE STATUS:full  Diet recommendation: regular  Brief/Interim Summary: 28 year old female with no significant past medical history admitted with sore throat fever, chills body ache for few days. Shewas seen on 3/9 in the ER with nausea vomiting diarrhea and flulike symptoms. Patient was found to have a small peritonsillar abscess epiglottitis and was admitted on IV antibiotic. Patient has now clinically improved, has been afebrile.  Tolerating diet.  Sore throat has much improved, feels ready to go home today. I have advised her to continue oral antibiotics and follow-up with ENT physician in a week  Discharge Diagnoses:  Active Problems:   Epiglottitis   Peritonsillar abscess  Epiglottitis wtinySingle5 mm right tonsillar/peritonsillarabscess: clinically improving. Afebrile. Pain is improving and tolerating liquid diet, no airway compromise.  Patient is clinically improved, afebrile, tolerating diet and soreness resolved. we will change antibiotics to po Clindamycin, oral steroid, and d./c home w instruction to follow-up with a ENT doctor  TobaccoAbuseadvised cessation.  Discharge Instructions  Discharge Instructions    Diet general   Complete by:  As directed    Discharge instructions   Complete by:  As directed    Please call call MD or return to ER for similar recurring problem, nausea/vomiting, uncontrolled pain, difficulty swallowing, fever or chills  Please follow-up with an ENT doctor call the number provided for appointment.   Please avoid alcohol, smoking, or any other illicit substance.    Increase activity slowly   Complete by:  As directed      Allergies as of 07/01/2018      Reactions   Peanut-containing Drug Products Anaphylaxis      Medication List    STOP taking these medications   naproxen 500 MG tablet Commonly known as:  NAPROSYN     TAKE these medications   acetaminophen 325 MG tablet Commonly known as:  TYLENOL Take 650 mg by mouth every 6 (six) hours as needed for mild pain, fever or headache.   chlorhexidine 0.12 % solution Commonly known as:  Peridex Use as directed 15 mLs in the mouth or throat 2 (two) times daily.   clindamycin 300 MG capsule Commonly known as:  Cleocin Take 2 capsules (600 mg total) by mouth 3 (three) times daily for 10 days.   diphenhydrAMINE 25 mg capsule Commonly known as:  Benadryl Allergy Take 1 capsule (25 mg total) by mouth every 6 (six) hours as needed.   ibuprofen 200 MG tablet Commonly known as:  ADVIL,MOTRIN Take 800 mg by mouth every 6 (six) hours as needed for fever, headache or mild pain.   methylPREDNISolone 4 MG Tbpk tablet Commonly known as:  MEDROL DOSEPAK Take orally 6 tab x 1 day,5 tab x1 day,4 tab x1 day,3 tab orally x1 day,2 tab x1 day then 1 tab x 1 day then stop as instructed on packet.   ondansetron 4 MG disintegrating tablet Commonly known as:  Zofran ODT Take 1 tablet (4 mg total) by mouth every 8 (eight) hours as needed for nausea or vomiting.   oxyCODONE-acetaminophen 5-325 MG tablet Commonly known as:  PERCOCET/ROXICET Take 1 tablet by mouth every 6 (six) hours as needed for severe pain.  Follow-up Information    Christia Reading, MD. Call in 2 day(s).   Specialty:  Otolaryngology Contact information: [address] [place] [address] [phone]          Allergies  Allergen Reactions  . Peanut-Containing Drug Products Anaphylaxis    Consultations:  ENT    Procedures/Studies: Dg Chest 2 View  Result Date: 06/28/2018 CLINICAL DATA:  Nausea,  vomiting, diarrhea, and chills starting today. EXAM: CHEST - 2 VIEW COMPARISON:  None. FINDINGS: Thoracolumbar scoliosis convex towards the right. Normal heart size and pulmonary vascularity. No focal airspace disease or consolidation in the lungs. No blunting of costophrenic angles. No pneumothorax. Mediastinal contours appear intact. IMPRESSION: No active cardiopulmonary disease. Electronically Signed   By: Burman Nieves M.D.   On: 06/28/2018 23:42   Ct Soft Tissue Neck W Contrast  Result Date: 06/29/2018 CLINICAL DATA:  Initial evaluation for acute sore throat, stridor. EXAM: CT NECK WITH CONTRAST TECHNIQUE: Multidetector CT imaging of the neck was performed using the standard protocol following the bolus administration of intravenous contrast. CONTRAST:  63mL OMNIPAQUE IOHEXOL 300 MG/ML  SOLN COMPARISON:  None available. FINDINGS: Pharynx and larynx: Oral cavity within normal limits without discrete mass or loculated collection. No acute abnormality about the dentition. Palatine tonsils fairly symmetric and within normal limits. Tiny 5 mm hypodense collection at the posterior aspect of the right tonsil, suggestive of a tiny tonsillar/peritonsillar micro abscess (series 3, image 29). Adjacent parapharyngeal fat maintained. Nasopharynx within normal limits. Vallecula partially effaced by the lingual tonsils. Epiglottis is abnormally thickened and edematous in appearance, suggesting acute epiglottitis/supraglottitis (series 5, image 52). No retropharyngeal collection. Remainder of the hypopharynx and supraglottic larynx within normal limits. Glottis is closed and not well assessed. Subglottic airway clear. Salivary glands: Salivary glands including the parotid, submandibular, and sublingual glands are normal. Thyroid: Normal. Lymph nodes: No pathologically enlarged lymph nodes identified within the neck. Vascular: Normal intravascular enhancement seen throughout the neck. Limited intracranial: Unremarkable.  Visualized orbits: Unremarkable. Mastoids and visualized paranasal sinuses: Tiny maxillary sinus retention cyst noted. Visualized paranasal sinuses are otherwise largely clear. Partially visualized mastoids clear as well. Skeleton: No acute osseous abnormality. No discrete lytic or blastic osseous lesions. Upper chest: Visualized upper chest demonstrates no acute finding. Partially visualized lungs are largely clear. Other: None. IMPRESSION: 1. Swollen and edematous appearance of the epiglottis, consistent with acute epiglottitis/supraglottitis. Oropharyngeal airway remains widely patent at this time. 2. Single tiny 5 mm right tonsillar/peritonsillar microabscess. Critical Value/emergent results were called by telephone at the time of interpretation on 06/29/2018 at 4:41 am to Dr. Pedro Earls [facility] , who verbally acknowledged these results. Electronically Signed   By: Rise Mu M.D.   On: 06/29/2018 04:43     Subjective: Resting Comfortably.  Afebrile.  Would like to go home today.  Discharge Exam: Vitals:   06/30/18 2102 07/01/18 0521  BP: 112/67 110/66  Pulse: 61 (!) 50  Resp: 19 18  Temp: 98.4 F (36.9 C) 98.5 F (36.9 C)  SpO2: 99% 98%   Vitals:   06/30/18 1033 06/30/18 1234 06/30/18 2102 07/01/18 0521  BP: 107/77  112/67 110/66  Pulse: (!) 59 63 61 (!) 50  Resp: (!) 21 15 19 18   Temp:   98.4 F (36.9 C) 98.5 F (36.9 C)  TempSrc:   Oral Oral  SpO2: 100% 99% 99% 98%  Weight:      Height:        General: Pt is alert, awake, not in  acute distress Cardiovascular: RRR, S1/S2 +, no rubs, no gallops Respiratory: CTA bilaterally, no wheezing, no rhonchi Abdominal: Soft, NT, ND, bowel sounds + Extremities: no edema, no cyanosis   The results of significant diagnostics from this hospitalization (including imaging, microbiology, ancillary and laboratory) are listed below for reference.     Microbiology: Recent Results (from the past 240 hour(s))  Urine culture     Status:  Abnormal   Collection Time: 06/26/18  7:22 PM  Result Value Ref Range Status   Specimen Description URINE, RANDOM  Final   Special Requests   Final    ADDED 06/26/18 AT 2034 Performed at [facility], [address], [place], [place] [address]    Culture MULTIPLE SPECIES PRESENT, SUGGEST RECOLLECTION (A)  Final   Report Status 06/28/2018 FINAL  Final  Group A Strep by PCR     Status: None   Collection Time: 06/28/18 11:12 PM  Result Value Ref Range Status   Group A Strep by PCR NOT DETECTED NOT DETECTED Final    Comment: Performed at [facility] Lab, [address] [place], [place] [address]  Culture, blood (x 2)     Status: None (Preliminary result)   Collection Time: 06/29/18  5:05 AM  Result Value Ref Range Status   Specimen Description BLOOD RIGHT ARM  Final   Special Requests   Final    BOTTLES DRAWN AEROBIC AND ANAEROBIC Blood Culture adequate volume   Culture   Final    NO GROWTH 1 DAY Performed at [facility] Lab, [address] [place], [place] [address]    Report Status PENDING  Incomplete  Culture, blood (x 2)     Status: None (Preliminary result)   Collection Time: 06/29/18  5:20 AM  Result Value Ref Range Status   Specimen Description BLOOD RIGHT HAND  Final   Special Requests   Final    BOTTLES DRAWN AEROBIC ONLY Blood Culture results may not be optimal due to an excessive volume of blood received in culture bottles   Culture   Final    NO GROWTH 1 DAY Performed at [facility] Lab, [address] [place], [place] [address]    Report Status PENDING  Incomplete  Surgical PCR screen     Status: None   Collection Time: 06/29/18  6:05 PM  Result Value Ref Range Status   MRSA, PCR NEGATIVE NEGATIVE Final   Staphylococcus aureus NEGATIVE NEGATIVE Final    Comment: (NOTE) The Xpert SA Assay (FDA approved for NASAL specimens in patients 60 years of age and older), is one component of a comprehensive surveillance program. It is not intended to  diagnose infection nor to guide or monitor treatment. Performed at [facility] Lab, [address] [place], [place] [address]      Labs: BNP (last 3 results) No results for input(s): BNP in the last 8760 hours. Basic Metabolic Panel: Recent Labs  Lab 06/25/18 1931 06/28/18 2319 06/30/18 0528  NA 138 133* 140  K 3.7 3.9 4.7  CL 105 108 109  CO2 GLUCOSE 117* 121* 117*  BUN CREATININE 0.83 0.91 0.72  CALCIUM 8.7* 8.3* 9.2   Liver Function Tests: Recent Labs  Lab 06/25/18 1931 06/28/18 2319  AST 24 23  ALT 11 13  ALKPHOS 53 48  BILITOT 1.2 0.7  PROT 6.5 6.5  ALBUMIN 3.6 3.3*   Recent Labs  Lab 06/25/18 1931  LIPASE 21   No results for input(s): AMMONIA in the last 168 hours. CBC: Recent Labs  Lab 06/25/18 1931 06/28/18 2319 06/30/18 0528  WBC 4.3 7.8 8.7  NEUTROABS  --   --  7.2  HGB 13.7 13.4 12.2  HCT 40.3 38.2 36.7  MCV 86.1 85.8 86.6  PLT 204 171 180   Cardiac Enzymes: No results for input(s): CKTOTAL, CKMB, CKMBINDEX, TROPONINI in the last 168 hours. BNP: Invalid input(s): POCBNP CBG: No results for input(s): GLUCAP in the last 168 hours. D-Dimer No results for input(s): DDIMER in the last 72 hours. Hgb A1c No results for input(s): HGBA1C in the last 72 hours. Lipid Profile No results for input(s): CHOL, HDL, LDLCALC, TRIG, CHOLHDL, LDLDIRECT in the last 72 hours. Thyroid function studies No results for input(s): TSH, T4TOTAL, T3FREE, THYROIDAB in the last 72 hours.  Invalid input(s): FREET3 Anemia work up No results for input(s): VITAMINB12, FOLATE, FERRITIN, TIBC, IRON, RETICCTPCT in the last 72 hours. Urinalysis    Component Value Date/Time   COLORURINE YELLOW 06/28/2018 2317   APPEARANCEUR CLOUDY (A) 06/28/2018 2317   LABSPEC 1.010 06/28/2018 2317   PHURINE 5.0 06/28/2018 2317   GLUCOSEU NEGATIVE 06/28/2018 2317   HGBUR SMALL (A) 06/28/2018 2317   BILIRUBINUR NEGATIVE 06/28/2018 2317   KETONESUR NEGATIVE  06/28/2018 2317   PROTEINUR NEGATIVE 06/28/2018 2317   NITRITE NEGATIVE 06/28/2018 2317   LEUKOCYTESUR TRACE (A) 06/28/2018 2317   Sepsis Labs Invalid input(s): PROCALCITONIN,  WBC,  LACTICIDVEN Microbiology Recent Results (from the past 240 hour(s))  Urine culture     Status: Abnormal   Collection Time: 06/26/18  7:22 PM  Result Value Ref Range Status   Specimen Description URINE, RANDOM  Final   Special Requests   Final    ADDED 06/26/18 AT 2034 Performed at [facility] Lab, [address] [place], [place] [address]    Culture MULTIPLE SPECIES PRESENT, SUGGEST RECOLLECTION (A)  Final   Report Status 06/28/2018 FINAL  Final  Group A Strep by PCR     Status: None   Collection Time: 06/28/18 11:12 PM  Result Value Ref Range Status   Group A Strep by PCR NOT DETECTED NOT DETECTED Final    Comment: Performed at [facility] Lab, [address], [place], [place] [address]  Culture, blood (x 2)     Status: None (Preliminary result)   Collection Time: 06/29/18  5:05 AM  Result Value Ref Range Status   Specimen Description BLOOD RIGHT ARM  Final   Special Requests   Final    BOTTLES DRAWN AEROBIC AND ANAEROBIC Blood Culture adequate volume   Culture   Final    NO GROWTH 1 DAY Performed at [facility] Lab, [address], [place], [place] [address]    Report Status PENDING  Incomplete  Culture, blood (x 2)     Status: None (Preliminary result)   Collection Time: 06/29/18  5:20 AM  Result Value Ref Range Status   Specimen Description BLOOD RIGHT HAND  Final   Special Requests   Final    BOTTLES DRAWN AEROBIC ONLY Blood Culture results may not be optimal due to an excessive volume of blood received in culture bottles   Culture   Final    NO GROWTH 1 DAY Performed at [facility] Lab, [address] [place], [place] [address]    Report Status PENDING  Incomplete  Surgical PCR screen     Status: None  Collection Time: 06/29/18  6:05 PM  Result Value Ref Range Status    MRSA, PCR NEGATIVE NEGATIVE Final   Staphylococcus aureus NEGATIVE NEGATIVE Final    Comment: (NOTE) The Xpert SA Assay (FDA approved for NASAL specimens in patients 76 years of age and older), is one component of a comprehensive surveillance program. It is not intended to diagnose infection nor to guide or monitor treatment. Performed at [facility] Lab, [address], [place], [place] [address]      Time coordinating discharge: 25 minutes  SIGNED:   Lanae Boast, MD  [facility] 07/01/2018, 11:28 AM  If 7PM-7AM, please contact night-coverage [email or web address]

## 2018-07-01 NOTE — TOC Transition Note (Signed)
Transition of Care [facility]) - CM/SW Discharge Note   Patient Details  Name: [name] MRN: [id number] Date of Birth: [birth year]  Transition of Care [facility]) CM/SW Contact:  Lawerance Sabal, RN Phone Number: 07/01/2018, 12:40 PM   Clinical Narrative:          Patient provided with MATCH letter, understands to call [facility] Monday for follow up appointment   Patient Goals and CMS Choice        Discharge Placement                       Discharge Plan and Services                        Social Determinants of Health (SDOH) Interventions     Readmission Risk Interventions No flowsheet data found.

## 2018-07-01 NOTE — Progress Notes (Signed)
Patient was discharged home by MD order; discharged instructions review and give to patient with care notes and prescriptions; IV DIC; skin intact; patient will be escorted to the car by nurse tech via wheelchair.  

## 2018-07-04 LAB — CULTURE, BLOOD (ROUTINE X 2)
Culture: NO GROWTH
Culture: NO GROWTH
Special Requests: ADEQUATE

## 2018-08-01 ENCOUNTER — Other Ambulatory Visit: Payer: Self-pay

## 2018-08-01 ENCOUNTER — Encounter (HOSPITAL_COMMUNITY): Payer: Self-pay

## 2018-08-01 ENCOUNTER — Ambulatory Visit (HOSPITAL_COMMUNITY)
Admission: EM | Admit: 2018-08-01 | Discharge: 2018-08-01 | Disposition: A | Discharge status: Home / Self Care | Payer: Self-pay | Attending: Family Medicine | Admitting: Family Medicine

## 2018-08-01 DIAGNOSIS — H6690 Otitis media, unspecified, unspecified ear: Secondary | ICD-10-CM

## 2018-08-01 MED ORDER — NEOMYCIN-POLYMYXIN-HC 3.5-10000-1 OT SUSP: 4.0000 [drp] | Freq: Three times a day (TID) | OTIC | 0 refills | Status: DC

## 2018-08-01 MED ORDER — ACETAMINOPHEN 325 MG PO TABS: 650.0000 mg | ORAL_TABLET | Freq: Four times a day (QID) | ORAL | 0 refills | Status: DC | PRN

## 2018-08-01 MED ORDER — AMOXICILLIN-POT CLAVULANATE 875-125 MG PO TABS: 1.0000 | ORAL_TABLET | Freq: Two times a day (BID) | ORAL | 0 refills | Status: DC

## 2018-08-01 MED ORDER — IBUPROFEN 200 MG PO TABS: 800.0000 mg | ORAL_TABLET | Freq: Four times a day (QID) | ORAL | 0 refills | Status: DC | PRN

## 2018-08-01 NOTE — ED Provider Notes (Signed)
[facility]    CSN: [id number] Arrival date & time: 08/01/18  1404     History   Chief Complaint Chief Complaint  Patient presents with   Fever    HPI Brenda [name] is [name] 28 y.o. female.   Patient is [name] 28 year old female with past medical history of epiglottitis, peritonsillar abscess, recurrent dental infections, ear infections.  She presents with approximately 2 days of waxing and waning fever, ear pain, body aches.  Her symptoms have been constant and worsening.  The pain is mostly in the left ear ear, but present in both ears.   She does have [name] history of earwax buildup and has been sticking [name] [name] pin in her ear to remove the wax.  Reports that her symptoms have worsened since she did this.  She has been taking ibuprofen for the pain and fever with mild relief.  Patient with recent admission for epiglottitis.  Denies any sore throat, trouble swallowing or breathing.  She has had no cough, chest congestion, nasal congestion or rhinorrhea.  No dental pain.  No recent traveling or sick contacts. No trismus, drooling.   ROS per HPI      Past Medical History:  Diagnosis Date   Epiglottitis 06/29/2018    Patient Active Problem List   Diagnosis Date Noted   Peritonsillar abscess 06/30/2018   Epiglottitis 06/29/2018    Past Surgical History:  Procedure Laterality Date   NO PAST SURGERIES      OB History   No obstetric history on file.      Home Medications    Prior to Admission medications   Medication Sig Start Date End Date Taking? Authorizing Provider  acetaminophen (TYLENOL) 325 MG tablet Take 2 tablets (650 mg total) by mouth every 6 (six) hours as needed for mild pain, fever or headache. 08/01/18   Brenda [name] [name], Brenda [name]  amoxicillin-clavulanate (AUGMENTIN) 875-125 MG tablet Take 1 tablet by mouth every 12 (twelve) hours. 08/01/18   Brenda [name] [name], Brenda [name]  diphenhydrAMINE (BENADRYL ALLERGY) 25 mg capsule Take 1 capsule (25 mg total) by mouth every 6  (six) hours as needed. 07/01/18   Brenda Boast, MD  ibuprofen (ADVIL,MOTRIN) 200 MG tablet Take 4 tablets (800 mg total) by mouth every 6 (six) hours as needed for fever, headache or mild pain. 08/01/18   Brenda [name], Brenda [name] [name], Brenda [name]  neomycin-polymyxin-hydrocortisone (CORTISPORIN) 3.5-10000-1 OTIC suspension Place 4 drops into both ears 3 (three) times daily. 08/01/18   Brenda [name], Brenda [name]    Family History Family History  Family history unknown: Yes    Social History Social History   Tobacco Use   Smoking status: Current Every Day Smoker    Packs/day: 1.00    Years: 9.00    Pack years: 9.00    Types: Cigarettes   Smokeless tobacco: Never Used  Substance Use Topics   Alcohol use: Not Currently   Drug use: Not Currently     Allergies   Peanut-containing drug products   Review of Systems Review of Systems   Physical Exam Triage Vital Signs ED Triage Vitals  Enc Vitals Group     BP 08/01/18 1417 (!) 118/59     Pulse Rate 08/01/18 1417 (!) 110     Resp 08/01/18 1417 16     Temp 08/01/18 1417 99.7 F (37.6 C)     Temp Source 08/01/18 1417 Oral     SpO2 08/01/18 1417 98 %     Weight --  Height --      Head Circumference --      Peak Flow --      Pain Score 08/01/18 1419 2     Pain Loc --      Pain Edu? --      Excl. in GC? --    No data found.  Updated Vital Signs BP (!) 118/59 (BP Location: Right Arm)    Pulse (!) 110    Temp 99.7 F (37.6 C) (Oral)    Resp 16    SpO2 98%   Visual Acuity Right Eye Distance:   Left Eye Distance:   Bilateral Distance:    Right Eye Near:   Left Eye Near:    Bilateral Near:     Physical Exam Vitals signs and nursing note reviewed.  Constitutional:      General: She is not in acute distress.    Appearance: Normal appearance. She is not ill-appearing, toxic-appearing or diaphoretic.  HENT:     Head: Normocephalic and atraumatic.     Right Ear: Hearing normal. There is impacted cerumen. Tympanic membrane is erythematous.      Left Ear: Decreased hearing noted. Tenderness present. There is impacted cerumen. Tympanic membrane is erythematous and retracted.     Ears:      Comments: Crusting and flaking to bilateral external ear canals with tenderness to touch. No purulent drainage.  No mastoid tenderness. Mild left facial swelling near the external ear No trismus No obvious dental infections, patient with multiple caries.    Mouth/Throat:     Pharynx: Oropharynx is clear.     Comments: No tonsillar swelling, exudates No uvula swelling No epiglottitis Eyes:     Conjunctiva/sclera: Conjunctivae normal.  Neck:     Musculoskeletal: Normal range of motion.  Cardiovascular:     Rate and Rhythm: Normal rate and regular rhythm.     Pulses: Normal pulses.     Heart sounds: Normal heart sounds.  Pulmonary:     Effort: Pulmonary effort is normal.     Breath sounds: Normal breath sounds.  Musculoskeletal: Normal range of motion.  Lymphadenopathy:     Cervical: No cervical adenopathy.  Skin:    General: Skin is warm and dry.  Neurological:     Mental Status: She is alert.  Psychiatric:        Mood and Affect: Mood normal.      UC Treatments / Results  Labs (all labs ordered are listed, but only abnormal results are displayed) Labs Reviewed - No data to display  EKG None  Radiology No results found.  Procedures Procedures (including critical care time)  Medications Ordered in UC Medications - No data to display  Initial Impression / Assessment and Plan / UC Course  I have reviewed the triage vital signs and the nursing notes.  Pertinent labs & imaging results that were available during my care of the patient were reviewed by me and considered in my medical decision making (see chart for details).    Otitis externa and otitis media, bilateral  Based on physical exam and symptoms we will go ahead and treat for bilateral otitis externa and otitis media. Treating with Augmentin twice [name] day for 7  days and neomycin eardrops. Attempted to irrigate ears and clinic but patient cannot tolerate. There was bilateral cerumen that was very hard and dark in color. Still able to view both TMs Instructed that if her symptoms continue or worsen over the next 24 to 48 hours  that she will need to come in for reevaluation Also instructed that if her symptoms become more severe she will need to go to the ER for further evaluation and management.  Tylenol and ibuprofen as needed for pain and fever Work note given Patient understand and agree to plan   Final Clinical Impressions(s) / UC Diagnoses   Final diagnoses:  Acute otitis media, unspecified otitis media type     Discharge Instructions     I am treating you for an ear infection with Augmentin Take this twice [name] day for the next 7 days You can take Tylenol or ibuprofen every 8 hours as needed for fever and pain If your symptoms continue or worsen despite treatment please follow-up here Friday. Sooner if much worse.     ED Prescriptions    Medication Sig Dispense Auth. Provider   amoxicillin-clavulanate (AUGMENTIN) 875-125 MG tablet Take 1 tablet by mouth every 12 (twelve) hours. 14 tablet Brenda [name] [name], Brenda [name]   ibuprofen (ADVIL,MOTRIN) 200 MG tablet Take 4 tablets (800 mg total) by mouth every 6 (six) hours as needed for fever, headache or mild pain. 30 tablet Brenda [name] [name], Brenda [name]   acetaminophen (TYLENOL) 325 MG tablet Take 2 tablets (650 mg total) by mouth every 6 (six) hours as needed for mild pain, fever or headache. 30 tablet Brenda [name] [name], Brenda [name]   neomycin-polymyxin-hydrocortisone (CORTISPORIN) 3.5-10000-1 OTIC suspension Place 4 drops into both ears 3 (three) times daily. 10 mL Brenda [name], Brenda Haberman [name], Brenda [name]     Controlled Substance Prescriptions [place] Controlled Substance Registry consulted? Not Applicable   Brenda [name], Brenda Meinhardt [name], Brenda [name] 08/01/18 1601

## 2018-08-01 NOTE — ED Triage Notes (Signed)
Pt presents with low grade fever after being sent home from work.

## 2018-08-01 NOTE — Discharge Instructions (Addendum)
I am treating you for an ear infection with Augmentin Take this twice a day for the next 7 days You can take Tylenol or ibuprofen every 8 hours as needed for fever and pain If your symptoms continue or worsen despite treatment please follow-up here Friday. Sooner if much worse.

## 2020-02-02 ENCOUNTER — Emergency Department (HOSPITAL_BASED_OUTPATIENT_CLINIC_OR_DEPARTMENT_OTHER): Payer: Self-pay

## 2020-02-02 ENCOUNTER — Encounter (HOSPITAL_BASED_OUTPATIENT_CLINIC_OR_DEPARTMENT_OTHER): Payer: Self-pay | Admitting: *Deleted

## 2020-02-02 ENCOUNTER — Other Ambulatory Visit: Payer: Self-pay

## 2020-02-02 ENCOUNTER — Emergency Department (HOSPITAL_BASED_OUTPATIENT_CLINIC_OR_DEPARTMENT_OTHER)
Admission: EM | Admit: 2020-02-02 | Discharge: 2020-02-02 | Disposition: A | Discharge status: Home / Self Care | Payer: Self-pay | Attending: Emergency Medicine | Admitting: Emergency Medicine

## 2020-02-02 DIAGNOSIS — J069 Acute upper respiratory infection, unspecified: Secondary | ICD-10-CM | POA: Insufficient documentation

## 2020-02-02 DIAGNOSIS — F17290 Nicotine dependence, other tobacco product, uncomplicated: Secondary | ICD-10-CM | POA: Insufficient documentation

## 2020-02-02 DIAGNOSIS — Z91010 Allergy to peanuts: Secondary | ICD-10-CM | POA: Insufficient documentation

## 2020-02-02 DIAGNOSIS — Z20822 Contact with and (suspected) exposure to covid-19: Secondary | ICD-10-CM | POA: Insufficient documentation

## 2020-02-02 DIAGNOSIS — J3489 Other specified disorders of nose and nasal sinuses: Secondary | ICD-10-CM | POA: Insufficient documentation

## 2020-02-02 DIAGNOSIS — R112 Nausea with vomiting, unspecified: Secondary | ICD-10-CM | POA: Insufficient documentation

## 2020-02-02 HISTORY — DX: Unspecified convulsions: R56.9

## 2020-02-02 LAB — PREGNANCY, URINE: Preg Test, Ur: NEGATIVE

## 2020-02-02 LAB — RESPIRATORY PANEL BY RT PCR (FLU A&B, COVID)
Influenza A by PCR: NEGATIVE
Influenza B by PCR: NEGATIVE
SARS Coronavirus 2 by RT PCR: NEGATIVE

## 2020-02-02 MED ORDER — NAPROXEN 500 MG PO TABS: 500.0000 mg | ORAL_TABLET | Freq: Two times a day (BID) | ORAL | 0 refills | Status: DC

## 2020-02-02 MED ORDER — ALBUTEROL SULFATE HFA 108 (90 BASE) MCG/ACT IN AERS: 1.0000 | INHALATION_SPRAY | Freq: Four times a day (QID) | RESPIRATORY_TRACT | 0 refills | Status: AC | PRN

## 2020-02-02 MED ORDER — CETIRIZINE HCL 5 MG PO TABS: 5.0000 mg | ORAL_TABLET | Freq: Every day | ORAL | 0 refills | Status: DC

## 2020-02-02 MED ORDER — ALBUTEROL SULFATE HFA 108 (90 BASE) MCG/ACT IN AERS
1.0000 | INHALATION_SPRAY | Freq: Once | RESPIRATORY_TRACT | Status: AC
  Administered 2020-02-02: 1 via RESPIRATORY_TRACT
  Filled 2020-02-02: qty 6.7

## 2020-02-02 MED ORDER — ONDANSETRON 4 MG PO TBDP: 4.0000 mg | ORAL_TABLET | Freq: Three times a day (TID) | ORAL | 0 refills | Status: DC | PRN

## 2020-02-02 NOTE — ED Provider Notes (Signed)
[facility] EMERGENCY DEPARTMENT Provider Note   CSN: [id number] Arrival date & time: 02/02/20  1627     History Chief Complaint  Patient presents with  . Cough    [name] is a 29 y.o. female who presents to ED for 2-day history of productive cough, body aches, nausea, vomiting, rhinorrhea, watery eyes.  No known Covid exposures.  She has tried cough drops and ibuprofen with only minimal improvement in her symptoms.  She feels like she is wheezing.  Denies any chest pain, abdominal pain or diarrhea.  Denies any sore throat.  HPI     Past Medical History:  Diagnosis Date  . Epiglottitis 06/29/2018  . Seizures [facility])     Patient Active Problem List   Diagnosis Date Noted  . Peritonsillar abscess 06/30/2018  . Epiglottitis 06/29/2018    Past Surgical History:  Procedure Laterality Date  . NO PAST SURGERIES       OB History   No obstetric history on file.     Family History  Family history unknown: Yes    Social History   Tobacco Use  . Smoking status: Current Every Day Smoker    Packs/day: 1.00    Years: 9.00    Pack years: 9.00    Types: Cigars  . Smokeless tobacco: Never Used  Vaping Use  . Vaping Use: Never used  Substance Use Topics  . Alcohol use: Yes  . Drug use: Yes    Types: Marijuana    Home Medications Prior to Admission medications   Medication Sig Start Date End Date Taking? Authorizing Provider  acetaminophen (TYLENOL) 325 MG tablet Take 2 tablets (650 mg total) by mouth every 6 (six) hours as needed for mild pain, fever or headache. 08/01/18   Dahlia Byes A, NP  albuterol (VENTOLIN HFA) 108 (90 Base) MCG/ACT inhaler Inhale 1-2 puffs into the lungs every 6 (six) hours as needed for wheezing or shortness of breath. 02/02/20   Naturi Alarid, PA-C  amoxicillin-clavulanate (AUGMENTIN) 875-125 MG tablet Take 1 tablet by mouth every 12 (twelve) hours. 08/01/18   Dahlia Byes A, NP  cetirizine (ZYRTEC) 5 MG tablet Take 1 tablet (5  mg total) by mouth daily. 02/02/20   Shabana Armentrout, PA-C  diphenhydrAMINE (BENADRYL ALLERGY) 25 mg capsule Take 1 capsule (25 mg total) by mouth every 6 (six) hours as needed. 07/01/18   Lanae Boast, MD  ibuprofen (ADVIL,MOTRIN) 200 MG tablet Take 4 tablets (800 mg total) by mouth every 6 (six) hours as needed for fever, headache or mild pain. 08/01/18   Dahlia Byes A, NP  naproxen (NAPROSYN) 500 MG tablet Take 1 tablet (500 mg total) by mouth 2 (two) times daily. 02/02/20   Jary Louvier, PA-C  neomycin-polymyxin-hydrocortisone (CORTISPORIN) 3.5-10000-1 OTIC suspension Place 4 drops into both ears 3 (three) times daily. 08/01/18   Bast, Gloris Manchester A, NP  ondansetron (ZOFRAN ODT) 4 MG disintegrating tablet Take 1 tablet (4 mg total) by mouth every 8 (eight) hours as needed for nausea or vomiting. 02/02/20   Crespin Forstrom, PA-C    Allergies    Peanut-containing drug products  Review of Systems   Review of Systems  Constitutional: Positive for chills. Negative for appetite change and fever.  HENT: Positive for rhinorrhea. Negative for ear pain, sneezing and sore throat.   Eyes: Negative for photophobia and visual disturbance.  Respiratory: Positive for cough and wheezing. Negative for chest tightness and shortness of breath.   Cardiovascular: Negative for chest pain and palpitations.  Gastrointestinal: Positive for nausea and vomiting. Negative for abdominal pain, blood in stool, constipation and diarrhea.  Genitourinary: Negative for dysuria, hematuria and urgency.  Musculoskeletal: Positive for myalgias.  Skin: Negative for rash.  Neurological: Negative for dizziness, weakness and light-headedness.    Physical Exam Updated Vital Signs BP (!) 128/98 (BP Location: Left Arm)   Pulse 96   Temp 98.6 F (37 C) (Oral)   Resp 18   Ht 5\' 3"  (1.6 m)   Wt 49.9 kg   LMP  (LMP Unknown)   SpO2 100%   BMI 19.49 kg/m   Physical Exam Vitals and nursing note reviewed.  Constitutional:      General: She  is not in acute distress.    Appearance: She is well-developed.  HENT:     Head: Normocephalic and atraumatic.     Nose: Nose normal.  Eyes:     General: No scleral icterus.       Left eye: No discharge.     Conjunctiva/sclera: Conjunctivae normal.  Cardiovascular:     Rate and Rhythm: Normal rate and regular rhythm.     Heart sounds: Normal heart sounds. No murmur heard.  No friction rub. No gallop.   Pulmonary:     Effort: Pulmonary effort is normal. No respiratory distress.     Breath sounds: Normal breath sounds.     Comments: Slight wheezing in lower lung fields. Abdominal:     General: Bowel sounds are normal. There is no distension.     Palpations: Abdomen is soft.     Tenderness: There is no abdominal tenderness. There is no guarding.  Musculoskeletal:        General: Normal range of motion.     Cervical back: Normal range of motion and neck supple.  Skin:    General: Skin is warm and dry.     Findings: No rash.  Neurological:     Mental Status: She is alert.     Motor: No abnormal muscle tone.     Coordination: Coordination normal.     ED Results / Procedures / Treatments   Labs (all labs ordered are listed, but only abnormal results are displayed) Labs Reviewed  RESPIRATORY PANEL BY RT PCR (FLU A&B, COVID)  PREGNANCY, URINE    EKG None  Radiology DG Chest 2 View  Result Date: 02/02/2020 CLINICAL DATA:  Productive cough EXAM: CHEST - 2 VIEW COMPARISON:  06/28/2018 FINDINGS: Heart and mediastinal contours are within normal limits. No focal opacities or effusions. No acute bony abnormality. Thoracolumbar scoliosis, stable. IMPRESSION: No active cardiopulmonary disease. Electronically Signed   By: 08/28/2018 M.D.   On: 02/02/2020 18:01    Procedures Procedures (including critical care time)  Medications Ordered in ED Medications  albuterol (VENTOLIN HFA) 108 (90 Base) MCG/ACT inhaler 1 puff (has no administration in time range)    ED Course  I have  reviewed the triage vital signs and the nursing notes.  Pertinent labs & imaging results that were available during my care of the patient were reviewed by me and considered in my medical decision making (see chart for details).    MDM Rules/Calculators/A&P                          [name] was evaluated in Emergency Department on 02/02/20 for the symptoms described in the history of present illness. He/she was evaluated in the context of the global COVID-19 pandemic, which necessitated consideration that  the patient might be at risk for infection with the SARS-CoV-2 virus that causes COVID-19. Institutional protocols and algorithms that pertain to the evaluation of patients at risk for COVID-19 are in a state of rapid change based on information released by regulatory bodies including the CDC and federal and state organizations. These policies and algorithms were followed during the patient's care in the ED.  29 year old female presenting to the ED for 2-day history of productive cough, body aches, rhinorrhea, nausea, vomiting and wheezing.  On exam there is slight wheezing noted in bilateral lower lung fields.  She is coughing.  Abdomen is soft, nontender nondistended.  Denies any throat pain or chest pain.  Chest x-ray is unremarkable.  Lab work including pregnancy test is negative, respiratory panel including flu and Covid test are negative.  Suspect viral cause of symptoms.  Will treat symptomatically.  Patient is agreeable to the plan.  Return precautions given.   Patient is hemodynamically stable, in NAD, and able to ambulate in the ED. Evaluation does not show pathology that would require ongoing emergent intervention or inpatient treatment. I explained the diagnosis to the patient. Pain has been managed and has no complaints prior to discharge. Patient is comfortable with above plan and is stable for discharge at this time. All questions were answered prior to disposition. Strict return  precautions for returning to the ED were discussed. Encouraged follow up with PCP.   An After Visit Summary was printed and given to the patient.   Portions of this note were generated with Scientist, clinical (histocompatibility and immunogenetics). Dictation errors may occur despite best attempts at proofreading.  Final Clinical Impression(s) / ED Diagnoses Final diagnoses:  Viral URI with cough    Rx / DC Orders ED Discharge Orders         Ordered    naproxen (NAPROSYN) 500 MG tablet  2 times daily        02/02/20 1932    cetirizine (ZYRTEC) 5 MG tablet  Daily        02/02/20 1933    ondansetron (ZOFRAN ODT) 4 MG disintegrating tablet  Every 8 hours PRN        02/02/20 1933    albuterol (VENTOLIN HFA) 108 (90 Base) MCG/ACT inhaler  Every 6 hours PRN        02/02/20 1933           Dietrich Pates, PA-C 02/02/20 1937    Terald Sleeper, MD 02/03/20 1139

## 2020-02-02 NOTE — ED Triage Notes (Signed)
Cough, runny nose, body aches, headache, sore throat since friday

## 2020-02-02 NOTE — Discharge Instructions (Addendum)
Take the medications as directed. Make sure you are drinking plenty of fluids and slowly advance your diet as tolerated. Return to the ER if you start to experience worsening symptoms, chest pain, shortness of breath, increased wheezing.

## 2020-03-19 ENCOUNTER — Ambulatory Visit (INDEPENDENT_AMBULATORY_CARE_PROVIDER_SITE_OTHER): Payer: Self-pay | Admitting: Neurology

## 2020-03-19 ENCOUNTER — Encounter: Payer: Self-pay | Admitting: Neurology

## 2020-03-19 ENCOUNTER — Other Ambulatory Visit: Payer: Self-pay

## 2020-03-19 ENCOUNTER — Telehealth: Payer: Self-pay | Admitting: Neurology

## 2020-03-19 VITALS — BP 114/72 | HR 78 | Ht 63.0 in | Wt 115.0 lb

## 2020-03-19 DIAGNOSIS — G40009 Localization-related (focal) (partial) idiopathic epilepsy and epileptic syndromes with seizures of localized onset, not intractable, without status epilepticus: Secondary | ICD-10-CM

## 2020-03-19 DIAGNOSIS — R569 Unspecified convulsions: Secondary | ICD-10-CM

## 2020-03-19 NOTE — Patient Instructions (Signed)
I had a long discussion with the patient regarding her longstanding history of febrile seizures and answered questions.  Recommend we check EEG and MRI scan of the brain with and without contrast.  We will hold off on anticonvulsants at the present time as she has been seizure-free for 3 years.  I recommend she avoid seizure triggers like fever ,stress, irregular eating and sleeping habits, extremes of exertion, stimulants like alcohol, marijuana and cocaine.  She is cleared for upcoming dental surgery but I advised her to stay away from narcotics which can lower the seizure threshold.  She will return for follow-up in the future in 3 months or call earlier if necessary.

## 2020-03-19 NOTE — Telephone Encounter (Signed)
self pay order sent to GI. They will reach out to the patient to schedule.  °
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
## 2020-04-30 ENCOUNTER — Emergency Department (HOSPITAL_BASED_OUTPATIENT_CLINIC_OR_DEPARTMENT_OTHER)
Admission: EM | Admit: 2020-04-30 | Discharge: 2020-04-30 | Disposition: A | Discharge status: Home / Self Care | Payer: 59 | Attending: Emergency Medicine | Admitting: Emergency Medicine

## 2020-04-30 ENCOUNTER — Other Ambulatory Visit: Payer: Self-pay

## 2020-04-30 ENCOUNTER — Encounter (HOSPITAL_BASED_OUTPATIENT_CLINIC_OR_DEPARTMENT_OTHER): Payer: Self-pay

## 2020-04-30 DIAGNOSIS — Z91010 Allergy to peanuts: Secondary | ICD-10-CM | POA: Diagnosis not present

## 2020-04-30 DIAGNOSIS — Z20822 Contact with and (suspected) exposure to covid-19: Secondary | ICD-10-CM

## 2020-04-30 DIAGNOSIS — F17290 Nicotine dependence, other tobacco product, uncomplicated: Secondary | ICD-10-CM | POA: Insufficient documentation

## 2020-04-30 DIAGNOSIS — R079 Chest pain, unspecified: Secondary | ICD-10-CM

## 2020-04-30 DIAGNOSIS — R509 Fever, unspecified: Secondary | ICD-10-CM | POA: Diagnosis present

## 2020-04-30 DIAGNOSIS — B349 Viral infection, unspecified: Secondary | ICD-10-CM

## 2020-04-30 DIAGNOSIS — R112 Nausea with vomiting, unspecified: Secondary | ICD-10-CM | POA: Diagnosis not present

## 2020-04-30 DIAGNOSIS — U071 COVID-19: Secondary | ICD-10-CM | POA: Diagnosis not present

## 2020-04-30 LAB — SARS CORONAVIRUS 2 (TAT 6-24 HRS): SARS Coronavirus 2: POSITIVE — AB

## 2020-04-30 MED ORDER — IBUPROFEN 800 MG PO TABS
800.0000 mg | ORAL_TABLET | Freq: Once | ORAL | Status: AC
  Administered 2020-04-30: 800 mg via ORAL
  Filled 2020-04-30: qty 1

## 2020-04-30 MED ORDER — METHOCARBAMOL 500 MG PO TABS: 500.0000 mg | ORAL_TABLET | Freq: Two times a day (BID) | ORAL | 0 refills | Status: DC

## 2020-04-30 MED ORDER — FLUTICASONE PROPIONATE 50 MCG/ACT NA SUSP: 2.0000 | Freq: Every day | NASAL | 0 refills | Status: AC

## 2020-04-30 MED ORDER — ONDANSETRON 4 MG PO TBDP: 4.0000 mg | ORAL_TABLET | Freq: Three times a day (TID) | ORAL | 0 refills | Status: DC | PRN

## 2020-04-30 MED ORDER — BENZONATATE 100 MG PO CAPS: 100.0000 mg | ORAL_CAPSULE | Freq: Three times a day (TID) | ORAL | 0 refills | Status: DC

## 2020-04-30 MED ORDER — PROMETHAZINE-DM 6.25-15 MG/5ML PO SYRP: 5.0000 mL | ORAL_SOLUTION | Freq: Four times a day (QID) | ORAL | 0 refills | Status: DC | PRN

## 2020-04-30 MED ORDER — ONDANSETRON 4 MG PO TBDP
4.0000 mg | ORAL_TABLET | Freq: Once | ORAL | Status: AC
  Administered 2020-04-30: 4 mg via ORAL
  Filled 2020-04-30: qty 1

## 2020-04-30 NOTE — ED Notes (Signed)
Water given for fluid challenge  ?

## 2020-04-30 NOTE — ED Notes (Signed)
ED Provider at bedside. 

## 2020-04-30 NOTE — ED Triage Notes (Signed)
Pt states she has had covid symptoms since 1/7. Pt c/o chest pain, fever, chills, n/v/d, body aches, lack of appetite, cough, and headache. Pt endorses sharp pain in the center of her chest since arrival to ED along with some shortness of breath.

## 2020-04-30 NOTE — ED Provider Notes (Signed)
[facility] EMERGENCY DEPARTMENT Provider Note   CSN: [id number] Arrival date & time: 04/30/20  0948     History Chief Complaint  Patient presents with  . Covid Symptoms    Nalda Shackleford is a 30 y.o. female.  HPI Patient is a 30 year old female with past medical history significant for seizures and epiglottitis who is presenting today with symptoms of fevers, chills, nausea vomiting diarrhea, body aches, decreased appetite, cough and headache.  She states that the symptoms been present since 1/7.  Her boyfriend was tested positive for COVID 1 day before this on 6 January.  She states that he seems to be feeling somewhat improved this time and her symptoms have persisted and she is concerned for this reason.  She states that today she also started having some chest pressure and pain that is sharp occurs when she coughs.  She states she has no exertional or pleuritic pain.  She states that the chest pain is associated with some shortness of breath denies any lightheadedness or dizziness.  No recent surgeries, hospitalization, long travel, hemoptysis, estrogen containing OCP, cancer history.  No unilateral leg swelling.  No history of PE or VTE.  No other associate symptoms.  No aggravating mitigating factors.  She is taken ibuprofen several days ago but no Tylenol or ibuprofen since.  She states that she has vomiting when she tries to take NSAIDs since her symptoms started on the seventh.    Past Medical History:  Diagnosis Date  . Epiglottitis 06/29/2018  . Scoliosis   . Seizures [facility])     Patient Active Problem List   Diagnosis Date Noted  . Peritonsillar abscess 06/30/2018  . Epiglottitis 06/29/2018    Past Surgical History:  Procedure Laterality Date  . NO PAST SURGERIES       OB History   No obstetric history on file.     Family History  Family history unknown: Yes    Social History   Tobacco Use  . Smoking status: Current Every Day Smoker     Packs/day: 1.00    Years: 9.00    Pack years: 9.00    Types: Cigars  . Smokeless tobacco: Never Used  Vaping Use  . Vaping Use: Never used  Substance Use Topics  . Alcohol use: Yes    Comment: socially  . Drug use: Not Currently    Types: Marijuana    Home Medications Prior to Admission medications   Medication Sig Start Date End Date Taking? Authorizing Provider  albuterol (VENTOLIN HFA) 108 (90 Base) MCG/ACT inhaler Inhale 1-2 puffs into the lungs every 6 (six) hours as needed for wheezing or shortness of breath. 02/02/20  Yes Khatri, Hina, PA-C  ibuprofen (ADVIL,MOTRIN) 200 MG tablet Take 4 tablets (800 mg total) by mouth every 6 (six) hours as needed for fever, headache or mild pain. 08/01/18  Yes Bast, Traci A, NP    Allergies    Peanut-containing drug products  Review of Systems   Review of Systems  Constitutional: Positive for chills, fatigue and fever.  HENT: Positive for congestion, postnasal drip, rhinorrhea and sore throat.   Eyes: Negative for redness.  Respiratory: Positive for cough and shortness of breath.   Cardiovascular: Positive for chest pain. Negative for leg swelling.  Gastrointestinal: Positive for diarrhea, nausea and vomiting. Negative for abdominal pain.  Endocrine: Negative for polyphagia.  Genitourinary: Negative for dysuria.  Musculoskeletal: Positive for myalgias.  Skin: Negative for rash.  Neurological: Negative for syncope and headaches.  Psychiatric/Behavioral: Negative for confusion.    Physical Exam Updated Vital Signs BP 101/75   Pulse 75   Temp 98 F (36.7 C) (Oral)   Resp (!) 21   Ht 5\' 2"  (1.575 m)   Wt 49.9 kg   SpO2 99%   BMI 20.12 kg/m   Physical Exam Vitals and nursing note reviewed.  Constitutional:      General: She is not in acute distress.    Comments: Pleasant 30 year old female well-appearing nontoxic nondiaphoretic.  She is sleeping upon my entry to the room.  Awakens to voice.  Becomes tearful during exam  stating that she feels horrible. No tachypnea until interview began.  No increased work of breathing.  HENT:     Head: Normocephalic and atraumatic.     Nose: Nose normal.  Eyes:     General: No scleral icterus. Cardiovascular:     Rate and Rhythm: Normal rate and regular rhythm.     Pulses: Normal pulses.     Heart sounds: Normal heart sounds.  Pulmonary:     Effort: Pulmonary effort is normal. No respiratory distress.     Breath sounds: No wheezing.     Comments: Lungs are clear to auscultation all fields.  SPO2 100% on monitor on room air.  Reproducible left anterior chest wall tenderness to palpation which completely reproduces patient's symptoms. Chest:     Chest wall: Tenderness present.  Abdominal:     Palpations: Abdomen is soft.     Tenderness: There is no abdominal tenderness. There is no guarding or rebound.  Musculoskeletal:     Cervical back: Normal range of motion.     Right lower leg: No edema.     Left lower leg: No edema.  Skin:    General: Skin is warm and dry.     Capillary Refill: Capillary refill takes less than 2 seconds.  Neurological:     Mental Status: She is alert. Mental status is at baseline.  Psychiatric:     Comments: Patient is tearful.  Quick to emotionally respond anxiously to information and questions.  Seems to have good insight though.  No SI HI or AVH.     ED Results / Procedures / Treatments   Labs (all labs ordered are listed, but only abnormal results are displayed) Labs Reviewed  SARS CORONAVIRUS 2 (TAT 6-24 HRS)    EKG EKG Interpretation  Date/Time:  Thursday April 30 2020 09:52:12 EST Ventricular Rate:  94 PR Interval:  130 QRS Duration: 86 QT Interval:  332 QTC Calculation: 415 R Axis:   47 Text Interpretation: Normal sinus rhythm with sinus arrhythmia Septal infarct , age undetermined Abnormal ECG No old tracing to compare Confirmed by 12-31-1990 [phone]) on 04/30/2020 10:07:08 AM   Radiology No results  found.  Procedures Procedures (including critical care time)  Medications Ordered in ED Medications  ibuprofen (ADVIL) tablet 800 mg (800 mg Oral Given 04/30/20 1141)  ondansetron (ZOFRAN-ODT) disintegrating tablet 4 mg (4 mg Oral Given 04/30/20 1141)    ED Course  I have reviewed the triage vital signs and the nursing notes.  Pertinent labs & imaging results that were available during my care of the patient were reviewed by me and considered in my medical decision making (see chart for details).    MDM Rules/Calculators/A&P                          Patient is 30 year old female with no pertinent  past medical history and she is unvaccinated presenting today with COVID symptoms since 1/7 she lives with her boyfriend who is positive for COVID.  See HPI for full details of symptoms.  She does describe some chest pain which is reproducible with palpation of the left chest wall.  Seems be worse with coughing and deep inhalation during entirety of ER visit apart from patient's triage vital she has not had any tachycardia.  She is primarily here because she has had some nausea and vomiting.  Has not taken any Tylenol ibuprofen.  She was given 1 dose of ibuprofen along with Zofran and p.o. challenge successfully.  On reassessment patient feels somewhat improved.  No episodes of emesis.  Code just pending at this time.  EKG nonischemic with no ST-T wave abnormalities.  Reviewed EKG finding physician with patient presentation.  Will discharge patient at this time.  Patient has strict return precautions and is understanding of my low suspicion for PE.  Apart from triage vital signs patient is PERC negative she has not been tachycardic during my evaluation at any time and her heart rate has been between 65 and 80.  Clea Dubach was evaluated in Emergency Department on 04/30/2020 for the symptoms described in the history of present illness. She was evaluated in the context of the global COVID-19  pandemic, which necessitated consideration that the patient might be at risk for infection with the SARS-CoV-2 virus that causes COVID-19. Institutional protocols and algorithms that pertain to the evaluation of patients at risk for COVID-19 are in a state of rapid change based on information released by regulatory bodies including the CDC and federal and state organizations. These policies and algorithms were followed during the patient's care in the ED. Final Clinical Impression(s) / ED Diagnoses Final diagnoses:  Suspected COVID-19 virus infection  Viral syndrome  Chest pain, unspecified type    Rx / DC Orders ED Discharge Orders    None       Gailen Shelter, [place] 04/30/20 1251    Pollyann Savoy, MD 05/01/20 1447

## 2020-04-30 NOTE — Discharge Instructions (Signed)
Your COVID test is pending; you should expect results within 24 hours. You can access your results on your MyChart--if you test positive you should receive a phone call.  In the meantime follow CDC guidelines and quarantine, wear a mask, wash hands often.   As we discussed please take Tylenol and ibuprofen for pain.  Please return to ER if you have any new or concerning symptoms. Please use Tylenol or ibuprofen for pain.  You may use 600 mg ibuprofen every 6 hours or 1000 mg of Tylenol every 6 hours.  You may choose to alternate between the 2.  This would be most effective.  Not to exceed 4 g of Tylenol within 24 hours.  Not to exceed 3200 mg ibuprofen 24 hours.   Please take over the counter vitamin D 2000-4000 units per day. I also recommend zinc 50 mg per day for the next two weeks.   Please return to ED if you feel have difficulty breathing or have emergent, new or concerning symptoms.  Patients who have symptoms consistent with COVID-19 should self isolated for: At least 3 days (72 hours) have passed since recovery, defined as resolution of fever without the use of fever reducing medications and improvement in respiratory symptoms (e.g., cough, shortness of breath), and At least 7 days have passed since symptoms first appeared.       Person Under Monitoring Name: [name]  Location: [address] [place] [address]   Infection Prevention Recommendations for Individuals Confirmed to have, or Being Evaluated for, 2019 Novel Coronavirus (COVID-19) Infection Who Receive Care at Home  Individuals who are confirmed to have, or are being evaluated for, COVID-19 should follow the prevention steps below until a healthcare provider or local or state health department says they can return to normal activities.  Stay home except to get medical care You should restrict activities outside your home, except for getting medical care. Do not go to work, school, or  public areas, and do not use public transportation or taxis.  Call ahead before visiting your doctor Before your medical appointment, call the healthcare provider and tell them that you have, or are being evaluated for, COVID-19 infection. This will help the healthcare providers office take steps to keep other people from getting infected. Ask your healthcare provider to call the local or state health department.  Monitor your symptoms Seek prompt medical attention if your illness is worsening (e.g., difficulty breathing). Before going to your medical appointment, call the healthcare provider and tell them that you have, or are being evaluated for, COVID-19 infection. Ask your healthcare provider to call the local or state health department.  Wear a facemask You should wear a facemask that covers your nose and mouth when you are in the same room with other people and when you visit a healthcare provider. People who live with or visit you should also wear a facemask while they are in the same room with you.  Separate yourself from other people in your home As much as possible, you should stay in a different room from other people in your home. Also, you should use a separate bathroom, if available.  Avoid sharing household items You should not share dishes, drinking glasses, cups, eating utensils, towels, bedding, or other items with other people in your home. After using these items, you should wash them thoroughly with soap and water.  Cover your coughs and sneezes Cover your mouth and nose with a tissue when you  cough or sneeze, or you can cough or sneeze into your sleeve. Throw used tissues in a lined trash can, and immediately wash your hands with soap and water for at least 20 seconds or use an alcohol-based hand rub.  Wash your [employer] your hands often and thoroughly with soap and water for at least 20 seconds. You can use an alcohol-based hand sanitizer if soap and water  are not available and if your hands are not visibly dirty. Avoid touching your eyes, nose, and mouth with unwashed hands.   Prevention Steps for Caregivers and Household Members of Individuals Confirmed to have, or Being Evaluated for, COVID-19 Infection Being Cared for in the Home  If you live with, or provide care at home for, a person confirmed to have, or being evaluated for, COVID-19 infection please follow these guidelines to prevent infection:  Follow healthcare providers instructions Make sure that you understand and can help the patient follow any healthcare provider instructions for all care.  Provide for the patients basic needs You should help the patient with basic needs in the home and provide support for getting groceries, prescriptions, and other personal needs.  Monitor the patients symptoms If they are getting sicker, call his or her medical provider and tell them that the patient has, or is being evaluated for, COVID-19 infection. This will help the healthcare providers office take steps to keep other people from getting infected. Ask the healthcare provider to call the local or state health department.  Limit the number of people who have contact with the patient If possible, have only one caregiver for the patient. Other household members should stay in another home or place of residence. If this is not possible, they should stay in another room, or be separated from the patient as much as possible. Use a separate bathroom, if available. Restrict visitors who do not have an essential need to be in the home.  Keep older adults, very young children, and other sick people away from the patient Keep older adults, very young children, and those who have compromised immune systems or chronic health conditions away from the patient. This includes people with chronic heart, lung, or kidney conditions, diabetes, and cancer.  Ensure good ventilation Make sure that  shared spaces in the home have good air flow, such as from an air conditioner or an opened window, weather permitting.  Wash your hands often Wash your hands often and thoroughly with soap and water for at least 20 seconds. You can use an alcohol based hand sanitizer if soap and water are not available and if your hands are not visibly dirty. Avoid touching your eyes, nose, and mouth with unwashed hands. Use disposable paper towels to dry your hands. If not available, use dedicated cloth towels and replace them when they become wet.  Wear a facemask and gloves Wear a disposable facemask at all times in the room and gloves when you touch or have contact with the patients blood, body fluids, and/or secretions or excretions, such as sweat, saliva, sputum, nasal mucus, vomit, urine, or feces.  Ensure the mask fits over your nose and mouth tightly, and do not touch it during use. Throw out disposable facemasks and gloves after using them. Do not reuse. Wash your hands immediately after removing your facemask and gloves. If your personal clothing becomes contaminated, carefully remove clothing and launder. Wash your hands after handling contaminated clothing. Place all used disposable facemasks, gloves, and other waste in a lined container  before disposing them with other household waste. Remove gloves and wash your hands immediately after handling these items.  Do not share dishes, glasses, or other household items with the patient Avoid sharing household items. You should not share dishes, drinking glasses, cups, eating utensils, towels, bedding, or other items with a patient who is confirmed to have, or being evaluated for, COVID-19 infection. After the person uses these items, you should wash them thoroughly with soap and water.  Wash laundry thoroughly Immediately remove and wash clothes or bedding that have blood, body fluids, and/or secretions or excretions, such as sweat, saliva, sputum,  nasal mucus, vomit, urine, or feces, on them. Wear gloves when handling laundry from the patient. Read and follow directions on labels of laundry or clothing items and detergent. In general, wash and dry with the warmest temperatures recommended on the label.  Clean all areas the individual has used often Clean all touchable surfaces, such as counters, tabletops, doorknobs, bathroom fixtures, toilets, phones, keyboards, tablets, and bedside tables, every day. Also, clean any surfaces that may have blood, body fluids, and/or secretions or excretions on them. Wear gloves when cleaning surfaces the patient has come in contact with. Use a diluted bleach solution (e.g., dilute bleach with 1 part bleach and 10 parts water) or a household disinfectant with a label that says EPA-registered for coronaviruses. To make a bleach solution at home, add 1 tablespoon of bleach to 1 quart (4 cups) of water. For a larger supply, add  cup of bleach to 1 gallon (16 cups) of water. Read labels of cleaning products and follow recommendations provided on product labels. Labels contain instructions for safe and effective use of the cleaning product including precautions you should take when applying the product, such as wearing gloves or eye protection and making sure you have good ventilation during use of the product. Remove gloves and wash hands immediately after cleaning.  Monitor yourself for signs and symptoms of illness Caregivers and household members are considered close contacts, should monitor their health, and will be asked to limit movement outside of the home to the extent possible. Follow the monitoring steps for close contacts listed on the symptom monitoring form.   ? If you have additional questions, contact your local health department or call the epidemiologist on call at [phone] (available 24/7). ? This guidance is subject to change. For the most up-to-date guidance from [facility], please refer to  their website: [email or web address]

## 2020-06-25 ENCOUNTER — Encounter: Payer: Self-pay | Admitting: Neurology

## 2020-06-25 ENCOUNTER — Ambulatory Visit: Payer: 59 | Admitting: Neurology

## 2020-06-25 ENCOUNTER — Other Ambulatory Visit: Payer: Self-pay

## 2020-06-25 VITALS — BP 119/81 | Ht 63.0 in | Wt 115.0 lb

## 2020-06-25 DIAGNOSIS — R5600 Simple febrile convulsions: Secondary | ICD-10-CM | POA: Diagnosis not present

## 2020-06-25 NOTE — Patient Instructions (Signed)
I had a long discussion the patient and her friend regarding her history of febrile seizures and discussed risk for seizure recurrence and need to avoid seizure provoking triggers like smoking cigarettes, marijuana, alcohol, sleep deprivation and extremes of diet and exertion.  The patient has not had any seizures for 3 years.  She is trying to get pregnant.  I recommend she and there is no need for seizure medications as she has been seizure-free more than 3 years but she needs to avoid seizure provoking stimuli.  Patient did not undergo MRI and EEG as previously she did not have health insurance but she has it as of beginning of this year and will hence undergo EEG and MRI scan.  She will return for follow-up in 6 months or call earlier if necessary.
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
## 2020-06-29 ENCOUNTER — Telehealth: Payer: Self-pay | Admitting: Neurology

## 2020-06-29 NOTE — Telephone Encounter (Signed)
[employer] pending uploaded notes on the portal  

## 2020-07-06 NOTE — Telephone Encounter (Signed)
unable to leave vmail the mail boxi s full  [facility] auth: [id number] (exp. 07/02/20 to 08/16/20)

## 2020-07-07 ENCOUNTER — Telehealth: Payer: Self-pay | Admitting: Neurology

## 2020-07-07 NOTE — Telephone Encounter (Signed)
error 

## 2020-07-07 NOTE — Telephone Encounter (Signed)
Called pt to schedule MRI- no answer and VM box full. [employer] [facility]: [id number] (exp. 07/02/20 to 08/16/20)

## 2020-07-27 ENCOUNTER — Other Ambulatory Visit: Payer: Self-pay

## 2020-07-27 ENCOUNTER — Emergency Department (HOSPITAL_BASED_OUTPATIENT_CLINIC_OR_DEPARTMENT_OTHER)
Admission: EM | Admit: 2020-07-27 | Discharge: 2020-07-27 | Disposition: A | Discharge status: Home / Self Care | Payer: 59 | Attending: Emergency Medicine | Admitting: Emergency Medicine

## 2020-07-27 ENCOUNTER — Encounter (HOSPITAL_BASED_OUTPATIENT_CLINIC_OR_DEPARTMENT_OTHER): Payer: Self-pay | Admitting: Emergency Medicine

## 2020-07-27 DIAGNOSIS — Z91010 Allergy to peanuts: Secondary | ICD-10-CM | POA: Insufficient documentation

## 2020-07-27 DIAGNOSIS — F17290 Nicotine dependence, other tobacco product, uncomplicated: Secondary | ICD-10-CM | POA: Insufficient documentation

## 2020-07-27 DIAGNOSIS — K029 Dental caries, unspecified: Secondary | ICD-10-CM | POA: Insufficient documentation

## 2020-07-27 DIAGNOSIS — K0889 Other specified disorders of teeth and supporting structures: Secondary | ICD-10-CM | POA: Diagnosis present

## 2020-07-27 MED ORDER — AMOXICILLIN 500 MG PO CAPS
500.0000 mg | ORAL_CAPSULE | Freq: Once | ORAL | Status: AC
  Administered 2020-07-27: 500 mg via ORAL
  Filled 2020-07-27: qty 1

## 2020-07-27 MED ORDER — OXYCODONE-ACETAMINOPHEN 5-325 MG PO TABS: 1.0000 | ORAL_TABLET | Freq: Four times a day (QID) | ORAL | 0 refills | Status: DC | PRN

## 2020-07-27 MED ORDER — BUPIVACAINE-EPINEPHRINE (PF) 0.5% -1:200000 IJ SOLN
1.8000 mL | Freq: Once | INTRAMUSCULAR | Status: AC
  Administered 2020-07-27: 1.8 mL
  Filled 2020-07-27: qty 1.8

## 2020-07-27 MED ORDER — AMOXICILLIN 500 MG PO CAPS: 500.0000 mg | ORAL_CAPSULE | Freq: Three times a day (TID) | ORAL | 0 refills | Status: DC

## 2020-07-27 MED ORDER — OXYCODONE-ACETAMINOPHEN 5-325 MG PO TABS
1.0000 | ORAL_TABLET | Freq: Once | ORAL | Status: AC
  Administered 2020-07-27: 1 via ORAL
  Filled 2020-07-27: qty 1

## 2020-07-27 NOTE — ED Triage Notes (Signed)
Right lower dental pain, chronic issue. Increased pain and swelling over the last day or two

## 2020-07-27 NOTE — ED Provider Notes (Signed)
[facility] Provider Note: Lowella Dell, MD, FACEP  CSN: [id number] MRN: [id number] ARRIVAL: 07/27/20 at 0304 ROOM: MH01/[facility]01   CHIEF COMPLAINT  Dental Pain   HISTORY OF PRESENT ILLNESS  07/27/20 3:12 AM [name] is a 30 y.o. female with severe pain in her right lower first molar for several days.  She rates the pain is a 10 out of 10, worse with eating or drinking.  She has not gotten relief with over-the-counter medications.  She was noted to have a low-grade fever on arrival.   Past Medical History:  Diagnosis Date  . Epiglottitis 06/29/2018  . Scoliosis   . Seizures (HCC)     Past Surgical History:  Procedure Laterality Date  . NO PAST SURGERIES      Family History  Family history unknown: Yes    Social History   Tobacco Use  . Smoking status: Current Every Day Smoker    Packs/day: 1.00    Years: 9.00    Pack years: 9.00    Types: Cigars  . Smokeless tobacco: Never Used  Vaping Use  . Vaping Use: Never used  Substance Use Topics  . Alcohol use: Yes    Comment: socially  . Drug use: Not Currently    Types: Marijuana    Prior to Admission medications   Medication Sig Start Date End Date Taking? Authorizing Provider  amoxicillin (AMOXIL) 500 MG capsule Take 1 capsule (500 mg total) by mouth 3 (three) times daily. 07/27/20  Yes Macala Baldonado, MD  oxyCODONE-acetaminophen (PERCOCET) 5-325 MG tablet Take 1 tablet by mouth every 6 (six) hours as needed for severe pain. 07/27/20  Yes Maguire Sime, MD  albuterol (VENTOLIN HFA) 108 (90 Base) MCG/ACT inhaler Inhale 1-2 puffs into the lungs every 6 (six) hours as needed for wheezing or shortness of breath. 02/02/20   Khatri, Hina, PA-C  benzonatate (TESSALON) 100 MG capsule Take 1 capsule (100 mg total) by mouth every 8 (eight) hours. 04/30/20   Fondaw, Rodrigo Ran, PA  fluticasone (FLONASE) 50 MCG/ACT nasal spray Place 2 sprays into both nostrils daily for 14 days. 04/30/20 05/14/20  Gailen Shelter, PA   ibuprofen (ADVIL,MOTRIN) 200 MG tablet Take 4 tablets (800 mg total) by mouth every 6 (six) hours as needed for fever, headache or mild pain. 08/01/18   Dahlia Byes A, NP  methocarbamol (ROBAXIN) 500 MG tablet Take 1 tablet (500 mg total) by mouth 2 (two) times daily. 04/30/20   Fondaw, Rodrigo Ran, PA  ondansetron (ZOFRAN ODT) 4 MG disintegrating tablet Take 1 tablet (4 mg total) by mouth every 8 (eight) hours as needed for nausea or vomiting. 04/30/20   Gailen Shelter, PA  promethazine-dextromethorphan (PROMETHAZINE-DM) 6.25-15 MG/5ML syrup Take 5 mLs by mouth 4 (four) times daily as needed for cough. 04/30/20   Gailen Shelter, PA    Allergies Peanut-containing drug products   REVIEW OF SYSTEMS  Negative except as noted here or in the History of Present Illness.   PHYSICAL EXAMINATION  Initial Vital Signs Blood pressure (!) 147/104, pulse (!) 102, temperature 99.7 F (37.6 C), temperature source Oral, resp. rate (!) 22, SpO2 100 %.  Examination General: Well-developed, well-nourished female in no acute distress; appearance consistent with age of record HENT: normocephalic; atraumatic; carious right lower first molar with tenderness to percussion Eyes: pupils equal, round and reactive to light; extraocular muscles intact Neck: supple; no lymphadenopathy Heart: regular rate and rhythm Lungs: clear to auscultation bilaterally Abdomen: soft; nondistended; nontender;  bowel sounds present Extremities: No deformity; full range of motion Neurologic: Awake, alert and oriented; motor function intact in all extremities and symmetric; no facial droop Skin: Warm and dry Psychiatric: Tearful; anxious   RESULTS  Summary of this visit's results, reviewed and interpreted by myself:   EKG Interpretation  Date/Time:    Ventricular Rate:    PR Interval:    QRS Duration:   QT Interval:    QTC Calculation:   R Axis:     Text Interpretation:        Laboratory Studies: No results found for  this or any previous visit (from the past 24 hour(s)). Imaging Studies: No results found.  ED COURSE and MDM  Nursing notes, initial and subsequent vitals signs, including pulse oximetry, reviewed and interpreted by myself.  Vitals:   07/27/20 0310  BP: (!) 147/104  Pulse: (!) 102  Resp: (!) 22  Temp: 99.7 F (37.6 C)  TempSrc: Oral  SpO2: 100%   Medications  amoxicillin (AMOXIL) capsule 500 mg (has no administration in time range)  oxyCODONE-acetaminophen (PERCOCET/ROXICET) 5-325 MG per tablet 1 tablet (has no administration in time range)  bupivacaine-epinephrine (MARCAINE W/ EPI) 0.5% -1:200000 injection 1.8 mL (1.8 mLs Infiltration Given by Other 07/27/20 1856)    We will refer to dentist on-call for definitive treatment.  PROCEDURES  Procedures DENTAL BLOCK 1.8 milliliters of 0.5% bupivacaine with epinephrine were injected into the buccal fold adjacent to the right lower first molar. The patient tolerated this well and there were no immediate complications. Adequate analgesia was obtained.   ED DIAGNOSES     ICD-10-CM   1. Pain due to dental caries  K02.9        Christiane Sistare, Jonny Ruiz, MD 07/27/20 [phone]

## 2020-12-22 ENCOUNTER — Encounter (HOSPITAL_BASED_OUTPATIENT_CLINIC_OR_DEPARTMENT_OTHER): Payer: Self-pay | Admitting: Emergency Medicine

## 2020-12-22 ENCOUNTER — Other Ambulatory Visit: Payer: Self-pay

## 2020-12-22 ENCOUNTER — Emergency Department (HOSPITAL_COMMUNITY): Payer: 59 | Admitting: Certified Registered Nurse Anesthetist

## 2020-12-22 ENCOUNTER — Ambulatory Visit (HOSPITAL_BASED_OUTPATIENT_CLINIC_OR_DEPARTMENT_OTHER)
Admission: EM | Admit: 2020-12-22 | Discharge: 2020-12-22 | Disposition: A | Discharge status: Home / Self Care | Payer: 59 | Attending: Obstetrics and Gynecology | Admitting: Obstetrics and Gynecology

## 2020-12-22 ENCOUNTER — Encounter (HOSPITAL_COMMUNITY)
Admission: EM | Disposition: A | Discharge status: Home / Self Care | Payer: Self-pay | Source: Home / Self Care | Attending: Emergency Medicine

## 2020-12-22 ENCOUNTER — Emergency Department (HOSPITAL_BASED_OUTPATIENT_CLINIC_OR_DEPARTMENT_OTHER): Payer: 59

## 2020-12-22 DIAGNOSIS — A599 Trichomoniasis, unspecified: Secondary | ICD-10-CM | POA: Insufficient documentation

## 2020-12-22 DIAGNOSIS — O00101 Right tubal pregnancy without intrauterine pregnancy: Secondary | ICD-10-CM | POA: Insufficient documentation

## 2020-12-22 DIAGNOSIS — O99331 Smoking (tobacco) complicating pregnancy, first trimester: Secondary | ICD-10-CM | POA: Diagnosis not present

## 2020-12-22 DIAGNOSIS — Z791 Long term (current) use of non-steroidal anti-inflammatories (NSAID): Secondary | ICD-10-CM | POA: Insufficient documentation

## 2020-12-22 DIAGNOSIS — Z20822 Contact with and (suspected) exposure to covid-19: Secondary | ICD-10-CM | POA: Insufficient documentation

## 2020-12-22 DIAGNOSIS — F17290 Nicotine dependence, other tobacco product, uncomplicated: Secondary | ICD-10-CM | POA: Diagnosis not present

## 2020-12-22 DIAGNOSIS — R1031 Right lower quadrant pain: Secondary | ICD-10-CM | POA: Diagnosis present

## 2020-12-22 DIAGNOSIS — Z91010 Allergy to peanuts: Secondary | ICD-10-CM | POA: Insufficient documentation

## 2020-12-22 DIAGNOSIS — Z79899 Other long term (current) drug therapy: Secondary | ICD-10-CM | POA: Insufficient documentation

## 2020-12-22 DIAGNOSIS — Z3A09 9 weeks gestation of pregnancy: Secondary | ICD-10-CM | POA: Insufficient documentation

## 2020-12-22 DIAGNOSIS — O3680X0 Pregnancy with inconclusive fetal viability, not applicable or unspecified: Secondary | ICD-10-CM

## 2020-12-22 DIAGNOSIS — O4690 Antepartum hemorrhage, unspecified, unspecified trimester: Secondary | ICD-10-CM

## 2020-12-22 HISTORY — PX: UNILATERAL SALPINGECTOMY: SHX6160

## 2020-12-22 HISTORY — PX: LAPAROTOMY: SHX154

## 2020-12-22 HISTORY — PX: DIAGNOSTIC LAPAROSCOPY WITH REMOVAL OF ECTOPIC PREGNANCY: SHX6449

## 2020-12-22 LAB — URINALYSIS, ROUTINE W REFLEX MICROSCOPIC
Bilirubin Urine: NEGATIVE
Glucose, UA: NEGATIVE mg/dL
Ketones, ur: NEGATIVE mg/dL
Nitrite: NEGATIVE
Protein, ur: NEGATIVE mg/dL
Specific Gravity, Urine: 1.015 (ref 1.005–1.030)
pH: 7 (ref 5.0–8.0)

## 2020-12-22 LAB — PREGNANCY, URINE: Preg Test, Ur: POSITIVE — AB

## 2020-12-22 LAB — CBC WITH DIFFERENTIAL/PLATELET
Abs Immature Granulocytes: 0.03 10*3/uL (ref 0.00–0.07)
Basophils Absolute: 0 10*3/uL (ref 0.0–0.1)
Basophils Relative: 0 %
Eosinophils Absolute: 0.1 10*3/uL (ref 0.0–0.5)
Eosinophils Relative: 1 %
HCT: 35.4 % — ABNORMAL LOW (ref 36.0–46.0)
Hemoglobin: 12.9 g/dL (ref 12.0–15.0)
Immature Granulocytes: 0 %
Lymphocytes Relative: 24 %
Lymphs Abs: 1.8 10*3/uL (ref 0.7–4.0)
MCH: 30.9 pg (ref 26.0–34.0)
MCHC: 36.4 g/dL — ABNORMAL HIGH (ref 30.0–36.0)
MCV: 84.9 fL (ref 80.0–100.0)
Monocytes Absolute: 0.8 10*3/uL (ref 0.1–1.0)
Monocytes Relative: 10 %
Neutro Abs: 4.9 10*3/uL (ref 1.7–7.7)
Neutrophils Relative %: 65 %
Platelets: 251 10*3/uL (ref 150–400)
RBC: 4.17 MIL/uL (ref 3.87–5.11)
RDW: 12.3 % (ref 11.5–15.5)
WBC: 7.5 10*3/uL (ref 4.0–10.5)
nRBC: 0 % (ref 0.0–0.2)

## 2020-12-22 LAB — COMPREHENSIVE METABOLIC PANEL
ALT: 10 U/L (ref 0–44)
AST: 15 U/L (ref 15–41)
Albumin: 3.7 g/dL (ref 3.5–5.0)
Alkaline Phosphatase: 43 U/L (ref 38–126)
Anion gap: 8 (ref 5–15)
BUN: 10 mg/dL (ref 6–20)
CO2: 23 mmol/L (ref 22–32)
Calcium: 9 mg/dL (ref 8.9–10.3)
Chloride: 105 mmol/L (ref 98–111)
Creatinine, Ser: 0.68 mg/dL (ref 0.44–1.00)
GFR, Estimated: >60 mL/min (ref >60)
Glucose, Bld: 87 mg/dL (ref 70–99)
Potassium: 3.8 mmol/L (ref 3.5–5.1)
Sodium: 136 mmol/L (ref 135–145)
Total Bilirubin: 0.9 mg/dL (ref 0.3–1.2)
Total Protein: 6.8 g/dL (ref 6.5–8.1)

## 2020-12-22 LAB — WET PREP, GENITAL
Sperm: NONE SEEN
Trich, Wet Prep: NONE SEEN
Yeast Wet Prep HPF POC: NONE SEEN

## 2020-12-22 LAB — RESP PANEL BY RT-PCR (FLU A&B, COVID) ARPGX2
Influenza A by PCR: NEGATIVE
Influenza B by PCR: NEGATIVE
SARS Coronavirus 2 by RT PCR: NEGATIVE

## 2020-12-22 LAB — TYPE AND SCREEN
ABO/RH(D): A POS
Antibody Screen: NEGATIVE

## 2020-12-22 LAB — URINALYSIS, MICROSCOPIC (REFLEX)

## 2020-12-22 LAB — HIV ANTIBODY (ROUTINE TESTING W REFLEX): HIV Screen 4th Generation wRfx: NONREACTIVE

## 2020-12-22 LAB — HCG, QUANTITATIVE, PREGNANCY: hCG, Beta Chain, Quant, S: 69379 m[IU]/mL — ABNORMAL HIGH (ref <5)

## 2020-12-22 SURGERY — LAPAROSCOPY, WITH ECTOPIC PREGNANCY SURGICAL TREATMENT
Anesthesia: General | Site: Abdomen | Laterality: Right

## 2020-12-22 MED ORDER — ONDANSETRON HCL 4 MG/2ML IJ SOLN
INTRAMUSCULAR | Status: DC | PRN
  Administered 2020-12-22: 4 mg via INTRAVENOUS

## 2020-12-22 MED ORDER — KETOROLAC TROMETHAMINE 30 MG/ML IJ SOLN
INTRAMUSCULAR | Status: DC | PRN
  Administered 2020-12-22: 30 mg via INTRAVENOUS

## 2020-12-22 MED ORDER — OXYCODONE HCL 5 MG/5ML PO SOLN
5.0000 mg | Freq: Once | ORAL | Status: AC | PRN
Start: 2020-12-22 — End: 2020-12-22

## 2020-12-22 MED ORDER — FENTANYL CITRATE (PF) 250 MCG/5ML IJ SOLN
INTRAMUSCULAR | Status: AC
  Filled 2020-12-22: qty 5

## 2020-12-22 MED ORDER — ONDANSETRON HCL 4 MG/2ML IJ SOLN
INTRAMUSCULAR | Status: AC
  Filled 2020-12-22: qty 2

## 2020-12-22 MED ORDER — ORAL CARE MOUTH RINSE: 15.0000 mL | Freq: Once | OROMUCOSAL | Status: AC

## 2020-12-22 MED ORDER — AMISULPRIDE (ANTIEMETIC) 5 MG/2ML IV SOLN
INTRAVENOUS | Status: AC
  Filled 2020-12-22: qty 4

## 2020-12-22 MED ORDER — CHLORHEXIDINE GLUCONATE 0.12 % MT SOLN: 15.0000 mL | Freq: Once | OROMUCOSAL | Status: AC

## 2020-12-22 MED ORDER — LACTATED RINGERS IV SOLN: INTRAVENOUS | Status: DC

## 2020-12-22 MED ORDER — CEFAZOLIN SODIUM 1 G IJ SOLR
INTRAMUSCULAR | Status: AC
  Filled 2020-12-22: qty 20

## 2020-12-22 MED ORDER — SUCCINYLCHOLINE 20MG/ML (10ML) SYRINGE FOR MEDFUSION PUMP - OPTIME
INTRAMUSCULAR | Status: DC | PRN
  Administered 2020-12-22: 100 mg via INTRAVENOUS

## 2020-12-22 MED ORDER — DEXAMETHASONE SODIUM PHOSPHATE 10 MG/ML IJ SOLN
INTRAMUSCULAR | Status: DC | PRN
  Administered 2020-12-22: 10 mg via INTRAVENOUS

## 2020-12-22 MED ORDER — 0.9 % SODIUM CHLORIDE (POUR BTL) OPTIME
TOPICAL | Status: DC | PRN
  Administered 2020-12-22 (×2): 1000 mL

## 2020-12-22 MED ORDER — SUGAMMADEX SODIUM 200 MG/2ML IV SOLN
INTRAVENOUS | Status: DC | PRN
  Administered 2020-12-22: 125 mg via INTRAVENOUS
  Administered 2020-12-22 (×3): 25 mg via INTRAVENOUS

## 2020-12-22 MED ORDER — KETOROLAC TROMETHAMINE 30 MG/ML IJ SOLN: 30.0000 mg | Freq: Once | INTRAMUSCULAR | Status: DC

## 2020-12-22 MED ORDER — ACETAMINOPHEN 10 MG/ML IV SOLN: 1000.0000 mg | Freq: Once | INTRAVENOUS | Status: DC | PRN

## 2020-12-22 MED ORDER — KETOROLAC TROMETHAMINE 30 MG/ML IJ SOLN
INTRAMUSCULAR | Status: AC
  Filled 2020-12-22: qty 1

## 2020-12-22 MED ORDER — DEXAMETHASONE SODIUM PHOSPHATE 10 MG/ML IJ SOLN
INTRAMUSCULAR | Status: AC
  Filled 2020-12-22: qty 1

## 2020-12-22 MED ORDER — OXYCODONE HCL 5 MG PO TABS
ORAL_TABLET | ORAL | Status: AC
  Filled 2020-12-22: qty 1

## 2020-12-22 MED ORDER — AMISULPRIDE (ANTIEMETIC) 5 MG/2ML IV SOLN
10.0000 mg | Freq: Once | INTRAVENOUS | Status: AC | PRN
  Administered 2020-12-22: 10 mg via INTRAVENOUS

## 2020-12-22 MED ORDER — PROPOFOL 10 MG/ML IV BOLUS
INTRAVENOUS | Status: DC | PRN
  Administered 2020-12-22: 50 mg via INTRAVENOUS
  Administered 2020-12-22: 150 mg via INTRAVENOUS

## 2020-12-22 MED ORDER — LIDOCAINE 2% (20 MG/ML) 5 ML SYRINGE
INTRAMUSCULAR | Status: DC | PRN
  Administered 2020-12-22: 60 mg via INTRAVENOUS

## 2020-12-22 MED ORDER — METRONIDAZOLE IVPB CUSTOM
1000.0000 mg | Freq: Once | INTRAVENOUS | Status: AC
  Administered 2020-12-22: 1000 mg via INTRAVENOUS
  Filled 2020-12-22: qty 200

## 2020-12-22 MED ORDER — FENTANYL CITRATE (PF) 100 MCG/2ML IJ SOLN
INTRAMUSCULAR | Status: AC
  Filled 2020-12-22: qty 2

## 2020-12-22 MED ORDER — ROCURONIUM BROMIDE 10 MG/ML (PF) SYRINGE
PREFILLED_SYRINGE | INTRAVENOUS | Status: AC
  Filled 2020-12-22: qty 10

## 2020-12-22 MED ORDER — SCOPOLAMINE 1 MG/3DAYS TD PT72
MEDICATED_PATCH | TRANSDERMAL | Status: DC | PRN
  Administered 2020-12-22: 1 via TRANSDERMAL

## 2020-12-22 MED ORDER — SUCCINYLCHOLINE CHLORIDE 200 MG/10ML IV SOSY
PREFILLED_SYRINGE | INTRAVENOUS | Status: AC
  Filled 2020-12-22: qty 10

## 2020-12-22 MED ORDER — LIDOCAINE 2% (20 MG/ML) 5 ML SYRINGE
INTRAMUSCULAR | Status: AC
  Filled 2020-12-22: qty 5

## 2020-12-22 MED ORDER — FENTANYL CITRATE (PF) 100 MCG/2ML IJ SOLN
50.0000 ug | Freq: Once | INTRAMUSCULAR | Status: AC
  Administered 2020-12-22: 50 ug via INTRAVENOUS

## 2020-12-22 MED ORDER — CEFAZOLIN SODIUM-DEXTROSE 2-3 GM-%(50ML) IV SOLR
INTRAVENOUS | Status: DC | PRN
  Administered 2020-12-22: 2 g via INTRAVENOUS

## 2020-12-22 MED ORDER — OXYCODONE HCL 5 MG PO TABS: 5.0000 mg | ORAL_TABLET | Freq: Four times a day (QID) | ORAL | 0 refills | Status: DC | PRN

## 2020-12-22 MED ORDER — FENTANYL CITRATE (PF) 250 MCG/5ML IJ SOLN
INTRAMUSCULAR | Status: DC | PRN
  Administered 2020-12-22: 25 ug via INTRAVENOUS
  Administered 2020-12-22 (×2): 50 ug via INTRAVENOUS

## 2020-12-22 MED ORDER — MIDAZOLAM HCL 2 MG/2ML IJ SOLN
INTRAMUSCULAR | Status: DC | PRN
Start: 2020-12-22 — End: 2020-12-22
  Administered 2020-12-22: 2 mg via INTRAVENOUS

## 2020-12-22 MED ORDER — ROCURONIUM BROMIDE 10 MG/ML (PF) SYRINGE
PREFILLED_SYRINGE | INTRAVENOUS | Status: DC | PRN
Start: 2020-12-22 — End: 2020-12-22
  Administered 2020-12-22: 20 mg via INTRAVENOUS
  Administered 2020-12-22: 30 mg via INTRAVENOUS

## 2020-12-22 MED ORDER — DOCUSATE SODIUM 100 MG PO CAPS: 100.0000 mg | ORAL_CAPSULE | Freq: Two times a day (BID) | ORAL | 2 refills | Status: DC

## 2020-12-22 MED ORDER — POVIDONE-IODINE 10 % EX SWAB
2.0000 "application " | Freq: Once | CUTANEOUS | Status: AC
  Administered 2020-12-22: 2 via TOPICAL

## 2020-12-22 MED ORDER — PROMETHAZINE HCL 25 MG/ML IJ SOLN: 6.2500 mg | INTRAMUSCULAR | Status: DC | PRN

## 2020-12-22 MED ORDER — ACETAMINOPHEN 500 MG PO TABS
1000.0000 mg | ORAL_TABLET | ORAL | Status: AC
  Administered 2020-12-22: 1000 mg via ORAL
  Filled 2020-12-22: qty 2

## 2020-12-22 MED ORDER — METRONIDAZOLE 500 MG PO TABS: ORAL_TABLET | ORAL | 0 refills | Status: DC

## 2020-12-22 MED ORDER — FENTANYL CITRATE (PF) 100 MCG/2ML IJ SOLN
25.0000 ug | INTRAMUSCULAR | Status: DC | PRN
  Administered 2020-12-22 (×3): 50 ug via INTRAVENOUS

## 2020-12-22 MED ORDER — MIDAZOLAM HCL 2 MG/2ML IJ SOLN
INTRAMUSCULAR | Status: AC
  Filled 2020-12-22: qty 2

## 2020-12-22 MED ORDER — CHLORHEXIDINE GLUCONATE 0.12 % MT SOLN
OROMUCOSAL | Status: AC
  Administered 2020-12-22: 15 mL via OROMUCOSAL
  Filled 2020-12-22: qty 15

## 2020-12-22 MED ORDER — SODIUM CHLORIDE 0.9 % IR SOLN
Status: DC | PRN
  Administered 2020-12-22: 1000 mL

## 2020-12-22 MED ORDER — IBUPROFEN 800 MG PO TABS: 800.0000 mg | ORAL_TABLET | Freq: Three times a day (TID) | ORAL | 0 refills | Status: DC | PRN

## 2020-12-22 MED ORDER — METRONIDAZOLE 500 MG/100ML IV SOLN
INTRAVENOUS | Status: AC
  Filled 2020-12-22: qty 200

## 2020-12-22 MED ORDER — OXYCODONE HCL 5 MG PO TABS
5.0000 mg | ORAL_TABLET | Freq: Once | ORAL | Status: AC | PRN
  Administered 2020-12-22: 5 mg via ORAL

## 2020-12-22 MED ORDER — BUPIVACAINE HCL (PF) 0.25 % IJ SOLN
INTRAMUSCULAR | Status: AC
  Filled 2020-12-22: qty 30

## 2020-12-22 MED ORDER — DEXMEDETOMIDINE (PRECEDEX) IN NS 20 MCG/5ML (4 MCG/ML) IV SYRINGE
PREFILLED_SYRINGE | INTRAVENOUS | Status: DC | PRN
  Administered 2020-12-22: 8 ug via INTRAVENOUS

## 2020-12-22 MED ORDER — SCOPOLAMINE 1 MG/3DAYS TD PT72
MEDICATED_PATCH | TRANSDERMAL | Status: AC
  Filled 2020-12-22: qty 1

## 2020-12-22 SURGICAL SUPPLY — 44 items
BENZOIN TINCTURE PRP APPL 2/3 (GAUZE/BANDAGES/DRESSINGS) ×3 IMPLANT
BLADE SURG 10 STRL SS (BLADE) ×3 IMPLANT
CABLE HIGH FREQUENCY MONO STRZ (ELECTRODE) IMPLANT
CATH ROBINSON RED A/P 16FR (CATHETERS) IMPLANT
CELLS DAT CNTRL 66122 CELL SVR (MISCELLANEOUS) ×2 IMPLANT
CLSR STERI-STRIP ANTIMIC 1/2X4 (GAUZE/BANDAGES/DRESSINGS) ×3 IMPLANT
DERMABOND ADVANCED (GAUZE/BANDAGES/DRESSINGS)
DERMABOND ADVANCED .7 DNX12 (GAUZE/BANDAGES/DRESSINGS) IMPLANT
DRAPE WARM FLUID 44X44 (DRAPES) ×3 IMPLANT
DRSG OPSITE POSTOP 3X4 (GAUZE/BANDAGES/DRESSINGS) IMPLANT
DRSG OPSITE POSTOP 4X6 (GAUZE/BANDAGES/DRESSINGS) ×3 IMPLANT
GLOVE SURG ENC MOIS LTX SZ6.5 (GLOVE) ×3 IMPLANT
GLOVE SURG UNDER POLY LF SZ6.5 (GLOVE) ×3 IMPLANT
GLOVE SURG UNDER POLY LF SZ7 (GLOVE) ×6 IMPLANT
GOWN STRL REUS W/ TWL LRG LVL3 (GOWN DISPOSABLE) ×4 IMPLANT
GOWN STRL REUS W/TWL LRG LVL3 (GOWN DISPOSABLE) ×2
KIT TURNOVER KIT B (KITS) ×3 IMPLANT
LIGASURE LAP L-HOOKWIRE 5 44CM (INSTRUMENTS) ×3 IMPLANT
LIGASURE VESSEL 5MM BLUNT TIP (ELECTROSURGICAL) IMPLANT
NEEDLE INSUFFLATION 14GA 120MM (NEEDLE) ×3 IMPLANT
NS IRRIG 1000ML POUR BTL (IV SOLUTION) ×3 IMPLANT
PACK LAPAROSCOPY BASIN (CUSTOM PROCEDURE TRAY) ×3 IMPLANT
PACK TRENDGUARD 450 HYBRID PRO (MISCELLANEOUS) ×2 IMPLANT
PENCIL SMOKE EVACUATOR (MISCELLANEOUS) ×3 IMPLANT
POUCH SPECIMEN RETRIEVAL 10MM (ENDOMECHANICALS) IMPLANT
PROTECTOR NERVE ULNAR (MISCELLANEOUS) ×6 IMPLANT
RTRCTR WOUND ALEXIS 18CM MED (MISCELLANEOUS) ×3
RTRCTR WOUND ALEXIS 18CM SML (INSTRUMENTS) ×3
SAVER CELL AAL HAEMONETICS (INSTRUMENTS) ×2 IMPLANT
SCISSORS LAP 5X35 DISP (ENDOMECHANICALS) IMPLANT
SET TUBE SMOKE EVAC HIGH FLOW (TUBING) ×3 IMPLANT
SLEEVE ENDOPATH XCEL 5M (ENDOMECHANICALS) ×3 IMPLANT
SPONGE T-LAP 18X18 ~~LOC~~+RFID (SPONGE) ×6 IMPLANT
SUT VIC AB 0 CT1 27 (SUTURE) ×2
SUT VIC AB 0 CT1 27XBRD ANBCTR (SUTURE) ×4 IMPLANT
SUT VIC AB 4-0 PS2 18 (SUTURE) ×6 IMPLANT
SUT VICRYL 0 UR6 27IN ABS (SUTURE) ×3 IMPLANT
SUT VICRYL RAPIDE 4/0 PS 2 (SUTURE) ×6 IMPLANT
TOWEL GREEN STERILE FF (TOWEL DISPOSABLE) ×6 IMPLANT
TRENDGUARD 450 HYBRID PRO PACK (MISCELLANEOUS) ×3
TROCAR XCEL NON-BLD 11X100MML (ENDOMECHANICALS) ×3 IMPLANT
TROCAR XCEL NON-BLD 5MMX100MML (ENDOMECHANICALS) ×3 IMPLANT
TUBE CONNECTING 12X1/4 (SUCTIONS) ×3 IMPLANT
WARMER LAPAROSCOPE (MISCELLANEOUS) ×3 IMPLANT

## 2020-12-22 NOTE — ED Provider Notes (Addendum)
[facility] EMERGENCY DEPARTMENT Provider Note   CSN: [id number] Arrival date & time: 12/22/20  2876     History Chief Complaint  Patient presents with   Vaginal Bleeding    [name] is a 30 y.o. female.  Patient states positive pregnancy test last week.  Maybe last cycle was 9 to 10 weeks ago.  She has had some spotting of blood last several days.  Has had intermittent abdominal pain  The history is provided by the patient.  Vaginal Bleeding Quality:  Lighter than menses Severity:  Mild Onset quality:  Gradual Timing:  Intermittent Progression:  Waxing and waning Chronicity:  New Possible pregnancy: yes   Context: spontaneously   Relieved by:  Nothing Worsened by:  Nothing Associated symptoms: abdominal pain   Associated symptoms: no back pain, no dizziness, no dyspareunia, no dysuria, no fever, no nausea and no vaginal discharge       Past Medical History:  Diagnosis Date   Epiglottitis 06/29/2018   Scoliosis    Seizures (HCC)     Patient Active Problem List   Diagnosis Date Noted   Peritonsillar abscess 06/30/2018   Epiglottitis 06/29/2018    Past Surgical History:  Procedure Laterality Date   NO PAST SURGERIES       OB History   No obstetric history on file.     Family History  Family history unknown: Yes    Social History   Tobacco Use   Smoking status: Every Day    Packs/day: 1.00    Years: 9.00    Pack years: 9.00    Types: Cigars, Cigarettes   Smokeless tobacco: Never  Vaping Use   Vaping Use: Never used  Substance Use Topics   Alcohol use: Yes    Comment: socially   Drug use: Not Currently    Types: Marijuana    Home Medications Prior to Admission medications   Medication Sig Start Date End Date Taking? Authorizing Provider  albuterol (VENTOLIN HFA) 108 (90 Base) MCG/ACT inhaler Inhale 1-2 puffs into the lungs every 6 (six) hours as needed for wheezing or shortness of breath. 02/02/20   Khatri, Hina, PA-C   amoxicillin (AMOXIL) 500 MG capsule Take 1 capsule (500 mg total) by mouth 3 (three) times daily. 07/27/20   Molpus, John, MD  benzonatate (TESSALON) 100 MG capsule Take 1 capsule (100 mg total) by mouth every 8 (eight) hours. 04/30/20   Fondaw, Rodrigo Ran, PA  fluticasone (FLONASE) 50 MCG/ACT nasal spray Place 2 sprays into both nostrils daily for 14 days. 04/30/20 05/14/20  Gailen Shelter, PA  ibuprofen (ADVIL,MOTRIN) 200 MG tablet Take 4 tablets (800 mg total) by mouth every 6 (six) hours as needed for fever, headache or mild pain. 08/01/18   Dahlia Byes A, NP  methocarbamol (ROBAXIN) 500 MG tablet Take 1 tablet (500 mg total) by mouth 2 (two) times daily. 04/30/20   Fondaw, Rodrigo Ran, PA  ondansetron (ZOFRAN ODT) 4 MG disintegrating tablet Take 1 tablet (4 mg total) by mouth every 8 (eight) hours as needed for nausea or vomiting. 04/30/20   Gailen Shelter, PA  oxyCODONE-acetaminophen (PERCOCET) 5-325 MG tablet Take 1 tablet by mouth every 6 (six) hours as needed for severe pain. 07/27/20   Molpus, John, MD  promethazine-dextromethorphan (PROMETHAZINE-DM) 6.25-15 MG/5ML syrup Take 5 mLs by mouth 4 (four) times daily as needed for cough. 04/30/20   Gailen Shelter, PA    Allergies    Peanut-containing drug products  Review of  Systems   Review of Systems  Constitutional:  Negative for chills and fever.  HENT:  Negative for ear pain and sore throat.   Eyes:  Negative for pain and visual disturbance.  Respiratory:  Negative for cough and shortness of breath.   Cardiovascular:  Negative for chest pain and palpitations.  Gastrointestinal:  Positive for abdominal pain. Negative for nausea and vomiting.  Genitourinary:  Positive for vaginal bleeding. Negative for dyspareunia, dysuria, hematuria and vaginal discharge.  Musculoskeletal:  Negative for arthralgias and back pain.  Skin:  Negative for color change and rash.  Neurological:  Negative for dizziness, seizures and syncope.  All other systems  reviewed and are negative.  Physical Exam Updated Vital Signs BP 125/88   Pulse 83   Temp 98.3 F (36.8 C) (Oral)   Resp 16   Ht 5\' 3"  (1.6 m)   Wt 51.7 kg   SpO2 99%   BMI 20.19 kg/m   Physical Exam Vitals and nursing note reviewed. Exam conducted with a chaperone present.  Constitutional:      General: She is not in acute distress.    Appearance: She is well-developed. She is not ill-appearing.  HENT:     Head: Normocephalic and atraumatic.     Nose: Nose normal.     Mouth/Throat:     Mouth: Mucous membranes are moist.  Eyes:     Extraocular Movements: Extraocular movements intact.     Conjunctiva/sclera: Conjunctivae normal.     Pupils: Pupils are equal, round, and reactive to light.  Cardiovascular:     Rate and Rhythm: Normal rate and regular rhythm.     Pulses: Normal pulses.     Heart sounds: Normal heart sounds. No murmur heard. Pulmonary:     Effort: Pulmonary effort is normal. No respiratory distress.     Breath sounds: Normal breath sounds.  Abdominal:     Palpations: Abdomen is soft.     Tenderness: There is no abdominal tenderness.  Genitourinary:    Labia:        Right: No tenderness.        Left: No tenderness.      Vagina: Normal.     Cervix: No cervical motion tenderness, discharge or cervical bleeding.     Uterus: Normal.      Adnexa: Right adnexa normal and left adnexa normal.     Comments: Cervical os closed, no blood clots Musculoskeletal:     Cervical back: Neck supple.  Skin:    General: Skin is warm and dry.     Capillary Refill: Capillary refill takes less than 2 seconds.  Neurological:     General: No focal deficit present.     Mental Status: She is alert.    ED Results / Procedures / Treatments   Labs (all labs ordered are listed, but only abnormal results are displayed) Labs Reviewed  WET PREP, GENITAL - Abnormal; Notable for the following components:      Result Value   Clue Cells Wet Prep HPF POC PRESENT (*)    WBC, Wet  Prep HPF POC MODERATE (*)    All other components within normal limits  PREGNANCY, URINE - Abnormal; Notable for the following components:   Preg Test, Ur POSITIVE (*)    All other components within normal limits  CBC WITH DIFFERENTIAL/PLATELET - Abnormal; Notable for the following components:   HCT 35.4 (*)    MCHC 36.4 (*)    All other components within normal limits  URINALYSIS,  ROUTINE W REFLEX MICROSCOPIC - Abnormal; Notable for the following components:   Hgb urine dipstick LARGE (*)    Leukocytes,Ua SMALL (*)    All other components within normal limits  HCG, QUANTITATIVE, PREGNANCY - Abnormal; Notable for the following components:   hCG, Beta Chain, Quant, S [id number] (*)    All other components within normal limits  URINALYSIS, MICROSCOPIC (REFLEX) - Abnormal; Notable for the following components:   Bacteria, UA RARE (*)    Trichomonas, UA PRESENT (*)    All other components within normal limits  RESP PANEL BY RT-PCR (FLU A&B, COVID) [id number]  COMPREHENSIVE METABOLIC PANEL  HIV ANTIBODY (ROUTINE TESTING W REFLEX)  GC/CHLAMYDIA PROBE AMP ([facility]) NOT AT [facility]    EKG None  Radiology US OB LESS THAN 14 WEEKS WITH OB TRANSVAGINAL  Result Date: 12/22/2020 CLINICAL DATA:  Right lower quadrant pain and vaginal bleeding. Estimated gestational age of [redacted] weeks, 5 days by LMP. EXAM: OBSTETRIC <14 WK [place] AND TRANSVAGINAL OB US TECHNIQUE: Both transabdominal and transvaginal ultrasound examinations were performed for complete evaluation of the gestation as well as the maternal uterus, adnexal regions, and pelvic cul-de-sac. Transvaginal technique was performed to assess early pregnancy. COMPARISON:  None. FINDINGS: Intrauterine gestational sac: None. Maternal uterus/adnexae: There is a complex mass in the right adnexa with increased flow, measuring 5.7 x 4.6 x 6.5 cm. This appears separate from the right ovary. 3.1 cm simple cyst in the right ovary. No follow up imaging recommended. Normal  left ovary. Small amount of free fluid in the pelvis. IMPRESSION: 1. No intrauterine pregnancy. Complex right adnexal mass separate from the right ovary, concerning for ectopic pregnancy. Critical Value/emergent results were called by telephone at the time of interpretation on 12/22/2020 at 9:41 am to provider Humna Moorehouse , who verbally acknowledged these results. Electronically Signed   By: Obie Dredge M.D.   On: 12/22/2020 09:47    Procedures .Critical Care  Date/Time: 12/22/2020 10:13 AM Performed by: Virgina Norfolk, DO Authorized by: Virgina Norfolk, DO   Critical care provider statement:    Critical care time (minutes):  45   Critical care was necessary to treat or prevent imminent or life-threatening deterioration of the following conditions: ectopic pregnancy going to OR.   Critical care was time spent personally by me on the following activities:  Development of treatment plan with patient or surrogate, blood draw for specimens, discussions with primary provider, evaluation of patient's response to treatment, obtaining history from patient or surrogate, pulse oximetry, re-evaluation of patient's condition, ordering and review of laboratory studies, ordering and review of radiographic studies, ordering and performing treatments and interventions and review of old charts   Care discussed with: admitting provider     Medications Ordered in ED Medications - No data to display  ED Course  I have reviewed the triage vital signs and the nursing notes.  Pertinent labs & imaging results that were available during my care of the patient were reviewed by me and considered in my medical decision making (see chart for details).    MDM Rules/Calculators/A&P                           [name] is here with vaginal bleeding.  Normal vitals.  No fever.  No major medical problems.  Patient may be 9 to [redacted] weeks pregnant.  Having some light vaginal bleeding.  Some intermittent abdominal pain.   This is her third pregnancy.  She had 1 miscarriage and her other pregnancy her child passed away shortly after birth.  She has her first prenatal visit tomorrow.  She has not had any imaging to confirm an IUP at this time.  She is Rh+.  Will check basic labs, get ultrasound.  Will check quantitative hCG.  Cervix was closed.  Very minimal bleeding on vaginal exam.  Positive trichomonas.  Patient made aware understands that any partners need to be treated.  Hemoglobin is stable.  hCG 70,000.  Radiology called me on the phone with concern for ectopic pregnancy in the right adnexal area.  Talked with Dr. Elon SpannerLeger with OB/GYN who agrees that this is likely ectopic pregnancy.  Hemodynamically she stable.  She is Rh+.  [facility] to go to emergent surgery.  Suspect ectopic pregnancy but could be hCG secreting mass as well.  This chart was dictated using voice recognition software.  Despite best efforts to proofread,  errors can occur which can change the documentation meaning.   Final Clinical Impression(s) / ED Diagnoses Final diagnoses:  Pregnancy of unknown anatomic location  Vaginal bleeding in pregnancy  Trichomonosis    Rx / DC Orders ED Discharge Orders     None        Virgina NorfolkCuratolo, Zakhi Dupre, DO 12/22/20 1013    Virgina Norfolkuratolo, Makenzee Choudhry, DO 12/22/20 1014

## 2020-12-22 NOTE — ED Notes (Signed)
Patient transported to Ultrasound 

## 2020-12-22 NOTE — ED Notes (Signed)
ED Provider at bedside. 

## 2020-12-22 NOTE — ED Triage Notes (Signed)
Pt presents to ED POV. Pt c/o vaginal bleeding since this morning. Pt reports that she had + preg test at [facility] last week. LMP 6/30. Pt reports not keeping up with her menstrual cycles well. Pt also reports sharp abd pain since last week.

## 2020-12-22 NOTE — Op Note (Signed)
DATE OF PROCEDURE: 12/22/20  PREOPERATIVE DIAGNOSIS: right-sided ectopic pregnancy  POSTOPERATIVE DIAGNOSIS: Same  PROCEDURE PERFORMED: Diagnostic laparoscopy converted to mini-laparotomy with right sided salpingectomy, evacuation of hemoperitoneum  SURGEON: Belva Agee, MD  ANESTHESIA: General  ESTIMATED BLOOD LOSS: 100 cc.  URINE OUTPUT: 200 cc of clear urine  COMPLICATIONS: None  TUBES: None.  DRAINS: None  PATHOLOGY: right fallopian tube with ectopic pregnancy  FINDINGS: On exam, under anesthesia, normal appearing vulva and vagina, a normal sized uterus.  Operative findings demonstrated a mildly enlarged fibroid but otherwise normal uterus. Left  fallopian tube normal. Right ovary mildly enlarged (known 3cm cyst). ~200cc of hemoperitoneum. Large ruptured right ectopic pregnancy in the right fallopian tube. The appendix was normal appearing. The bowel and omentum were normal appearing.  Procedure: A general anesthesia was induced and the patient was placed in the dirsal lithotomy position. The abdomen, perineum, and vagina were prepped and draped in the usual fashion. The bladder was drained. A sponge stick was placed in the vagina for manipulation purposes being careful not to puncture the uterus. Attention was then turned to the abdomen.    An infraumbilical incision was made and the Veress needle was gently advanced taking care to feel for the typical sensation of penetrating the peritoneum. With CO2 infiltration, an opening pressure of was noted, and following this, a pneumoperitoneum of 15 mmHg was created. A 11 mm trocar was then passed through the same incision and the laparoscope was then inserted through the trocar sleeve.   Visualization of the peritoneal cavity was then obtained and a brief inspection did not reveal any signs of complications from entry. Under direct observation, 34mm suprapubic port was placed taking care to respect anatomical landmarks and vessels.  Once the placement of the port was complete, the actual laparoscopic procedure began. Above operative findings noted.  A grasper was used to isolate the right fallopian tube following it down to its fimbriated end. Ureter was visualized. The ectopic pregnancy was visualized in that tube. However, it was densely adherent to the right ovary and pelvic side wall and very large. Decision made to convert to mini laparotomy. 2g ancef given in addition to flagyl 1000mg  for full laparotomy coverage.  A 5cm mini lap incision was made and carried down to the underlying fascia. Fascia was incised in the midline and extended bilaterally with mayo scissors. Underlying rectus muscles were separated from the fascia superiorly and inferiorly in the usual fashion. Peritoneum was entered sharply with hemostat and metz with good visualization of the bladder and bowel. Once inside the abdominal cavity, a self-retaining retractor was placed.  Once confirmation of fallopian tube with ectopic pregnancy was done, dense adhesive disease was taken down sharply. Once the right fallopian tube was freed from surrounding adhesions, the ligasure was used to march directly under the tube to remove the fallopian tube with associated ectopic pregnancy.  Hemostasis was noted.   All instruments were removed from all places in the body. Fascia was closed with 0' vicryl and skin closed with 4'0 vicryl. The sponge and lap counts were correct times 2 at this time.   The patient's procedure was terminated. We then awakened her. She was sent to the Recovery Room in good condition.      MD

## 2020-12-22 NOTE — H&P (Addendum)
[name] is an 30 y.o. female. Presenting with vaginal bleeding(very light_) and new onset RLQ pain. She was evaluated by the ED and found to have a RLQ adnexal mass suspicious for an ectopic pregnancy. She was also diagnosed with trichomoniasis on wet prep. She has a hx of seizures many years ago not requiring meds since. This is a desire and planned pregnancy. She works at a [employer].   Pertinent Gynecological History: Menses: flow is moderate Bleeding: intermenstrual bleeding Contraception: none DES exposure: denies Blood transfusions: none Sexually transmitted diseases: recent diagnosis: trichomoniasis.  Previous GYN Procedures:  none   OB History: G3, P1010 G1: SAB G2: term delivery with neonatal demise. Per patient, she reports getting routine OB care but the baby was born w/out lung maturation and he passed after 12 hours. Went into labor on her own.    Menstrual History No LMP recorded. (Menstrual status: Irregular Periods).    Past Medical History:  Diagnosis Date   Epiglottitis 06/29/2018   Scoliosis    Seizures (HCC)     Past Surgical History:  Procedure Laterality Date   NO PAST SURGERIES      Family History  Family history unknown: Yes    Social History:  reports that she has been smoking cigars. She has a 9.00 pack-year smoking history. She has never used smokeless tobacco. She reports current alcohol use. She reports that she does not currently use drugs after having used the following drugs: Marijuana.  Allergies:  Allergies  Allergen Reactions   Peanut-Containing Drug Products Anaphylaxis    Medications Prior to Admission  Medication Sig Dispense Refill Last Dose   albuterol (VENTOLIN HFA) 108 (90 Base) MCG/ACT inhaler Inhale 1-2 puffs into the lungs every 6 (six) hours as needed for wheezing or shortness of breath. 16 g 0    amoxicillin (AMOXIL) 500 MG capsule Take 1 capsule (500 mg total) by mouth 3 (three) times daily. 21  capsule 0    benzonatate (TESSALON) 100 MG capsule Take 1 capsule (100 mg total) by mouth every 8 (eight) hours. 21 capsule 0    fluticasone (FLONASE) 50 MCG/ACT nasal spray Place 2 sprays into both nostrils daily for 14 days. 11.1 mL 0    ibuprofen (ADVIL,MOTRIN) 200 MG tablet Take 4 tablets (800 mg total) by mouth every 6 (six) hours as needed for fever, headache or mild pain. 30 tablet 0    methocarbamol (ROBAXIN) 500 MG tablet Take 1 tablet (500 mg total) by mouth 2 (two) times daily. 20 tablet 0    ondansetron (ZOFRAN ODT) 4 MG disintegrating tablet Take 1 tablet (4 mg total) by mouth every 8 (eight) hours as needed for nausea or vomiting. 20 tablet 0    oxyCODONE-acetaminophen (PERCOCET) 5-325 MG tablet Take 1 tablet by mouth every 6 (six) hours as needed for severe pain. 12 tablet 0    promethazine-dextromethorphan (PROMETHAZINE-DM) 6.25-15 MG/5ML syrup Take 5 mLs by mouth 4 (four) times daily as needed for cough. 118 mL 0     Review of Systems  Blood pressure 129/72, pulse 76, temperature 98.1 F (36.7 C), temperature source Oral, resp. rate 18, height 5\' 3"  (1.6 m), weight 51.7 kg, SpO2 100 %. Physical Exam Gen: well appearing, NAD CV: Reg rate Pulm: NWOB Abd: soft, mildly distended, mildly tender, no rebound/guarding, no masses GYN: deferred given ectopic Ext: No edema b/l   Results for orders placed or performed during the hospital encounter of 12/22/20 (from the past 24 hour(s))  Pregnancy, urine     Status: Abnormal   Collection Time: 12/22/20  7:18 AM  Result Value Ref Range   Preg Test, Ur POSITIVE (A) NEGATIVE  CBC with Differential     Status: Abnormal   Collection Time: 12/22/20  7:18 AM  Result Value Ref Range   WBC 7.5 4.0 - 10.5 K/uL   RBC 4.17 3.87 - 5.11 MIL/uL   Hemoglobin 12.9 12.0 - 15.0 g/dL   HCT 92.4 (L) 26.8 - 34.1 %   MCV 84.9 80.0 - 100.0 fL   MCH 30.9 26.0 - 34.0 pg   MCHC 36.4 (H) 30.0 - 36.0 g/dL   RDW 96.2 22.9 - 79.8 %   Platelets 251 150 -  400 K/uL   nRBC 0.0 0.0 - 0.2 %   Neutrophils Relative % 65 %   Neutro Abs 4.9 1.7 - 7.7 K/uL   Lymphocytes Relative 24 %   Lymphs Abs 1.8 0.7 - 4.0 K/uL   Monocytes Relative 10 %   Monocytes Absolute 0.8 0.1 - 1.0 K/uL   Eosinophils Relative 1 %   Eosinophils Absolute 0.1 0.0 - 0.5 K/uL   Basophils Relative 0 %   Basophils Absolute 0.0 0.0 - 0.1 K/uL   Immature Granulocytes 0 %   Abs Immature Granulocytes 0.03 0.00 - 0.07 K/uL  Comprehensive metabolic panel     Status: None   Collection Time: 12/22/20  7:18 AM  Result Value Ref Range   Sodium 136 135 - 145 mmol/L   Potassium 3.8 3.5 - 5.1 mmol/L   Chloride 105 98 - 111 mmol/L   CO2 23 22 - 32 mmol/L   Glucose, Bld 87 70 - 99 mg/dL   BUN 10 6 - 20 mg/dL   Creatinine, Ser 9.21 0.44 - 1.00 mg/dL   Calcium 9.0 8.9 - 19.4 mg/dL   Total Protein 6.8 6.5 - 8.1 g/dL   Albumin 3.7 3.5 - 5.0 g/dL   AST 15 15 - 41 U/L   ALT 10 0 - 44 U/L   Alkaline Phosphatase 43 38 - 126 U/L   Total Bilirubin 0.9 0.3 - 1.2 mg/dL   GFR, Estimated >17 >40 mL/min   Anion gap 8 5 - 15  Wet prep, genital     Status: Abnormal   Collection Time: 12/22/20  7:18 AM  Result Value Ref Range   Yeast Wet Prep HPF POC NONE SEEN NONE SEEN   Trich, Wet Prep NONE SEEN NONE SEEN   Clue Cells Wet Prep HPF POC PRESENT (A) NONE SEEN   WBC, Wet Prep HPF POC MODERATE (A) NONE SEEN   Sperm NONE SEEN   Urinalysis, Routine w reflex microscopic     Status: Abnormal   Collection Time: 12/22/20  7:18 AM  Result Value Ref Range   Color, Urine YELLOW YELLOW   APPearance CLEAR CLEAR   Specific Gravity, Urine 1.015 1.005 - 1.030   pH 7.0 5.0 - 8.0   Glucose, UA NEGATIVE NEGATIVE mg/dL   Hgb urine dipstick LARGE (A) NEGATIVE   Bilirubin Urine NEGATIVE NEGATIVE   Ketones, ur NEGATIVE NEGATIVE mg/dL   Protein, ur NEGATIVE NEGATIVE mg/dL   Nitrite NEGATIVE NEGATIVE   Leukocytes,Ua SMALL (A) NEGATIVE  hCG, quantitative, pregnancy     Status: Abnormal   Collection Time:  12/22/20  7:18 AM  Result Value Ref Range   hCG, Beta Chain, Quant, S 69,379 (H) <5 mIU/mL  HIV Antibody (routine testing w rflx)     Status: None  Collection Time: 12/22/20  7:18 AM  Result Value Ref Range   HIV Screen 4th Generation wRfx Non Reactive Non Reactive  Urinalysis, Microscopic (reflex)     Status: Abnormal   Collection Time: 12/22/20  7:18 AM  Result Value Ref Range   RBC / HPF 0-5 0 - 5 RBC/hpf   WBC, UA 6-10 0 - 5 WBC/hpf   Bacteria, UA RARE (A) NONE SEEN   Squamous Epithelial / LPF 0-5 0 - 5   Trichomonas, UA PRESENT (A) NONE SEEN  Resp Panel by RT-PCR (Flu A&B, Covid) Nasopharyngeal Swab     Status: None   Collection Time: 12/22/20 10:09 AM   Specimen: Nasopharyngeal Swab; Nasopharyngeal(NP) swabs in vial transport medium  Result Value Ref Range   SARS Coronavirus 2 by RT PCR NEGATIVE NEGATIVE   Influenza A by PCR NEGATIVE NEGATIVE   Influenza B by PCR NEGATIVE NEGATIVE    US OB LESS THAN 14 WEEKS WITH OB TRANSVAGINAL  Result Date: 12/22/2020 CLINICAL DATA:  Right lower quadrant pain and vaginal bleeding. Estimated gestational age of [redacted] weeks, 5 days by LMP. EXAM: OBSTETRIC <14 WK US AND TRANSVAGINAL OB US TECHNIQUE: Both transabdominal and transvaginal ultrasound examinations were performed for complete evaluation of the gestation as well as the maternal uterus, adnexal regions, and pelvic cul-de-sac. Transvaginal technique was performed to assess early pregnancy. COMPARISON:  None. FINDINGS: Intrauterine gestational sac: None. Maternal uterus/adnexae: There is a complex mass in the right adnexa with increased flow, measuring 5.7 x 4.6 x 6.5 cm. This appears separate from the right ovary. 3.1 cm simple cyst in the right ovary. No follow up imaging recommended. Normal left ovary. Small amount of free fluid in the pelvis. IMPRESSION: 1. No intrauterine pregnancy. Complex right adnexal mass separate from the right ovary, concerning for ectopic pregnancy. Critical  Value/emergent results were called by telephone at the time of interpretation on 12/22/2020 at 9:41 am to provider ADAM CURATOLO , who verbally acknowledged these results. Electronically Signed   By: Obie DredgeWilliam T Derry M.D.   On: 12/22/2020 09:47    Assessment/Plan: 30 yo G3P2101 presenting with vaginal spotting and RLQ pain found to have a suspected ectopic pregnancy.  She was counseled regarding her options including MTX and diagnostic laparoscopy with right sided salpingectomy. After careful consideration of risks/benefits of both options, she elects to have diagnostic laparoscopy with right sided salpingectomy which was my recommendation. I feel she is a poor candidate for MTX given size of likely ectopic, abdominal distention, and HCG of 70,000. Risks discussed including infection, bleeding, damage to surrounding structures, and the need for additional procedures. Discussed following HCG until <5, pelvic rest until then and until at least 2 weeks postop AND until trich is treated, and the importance of not becoming pregnant as to skew HCG results.We also discussed pelvic rest, lifting precautions, and the typical postoperative course. We reviewed the slight decrease in fertility associated with a unilateral salpingectomy. If her other tube was to be damaged, she declines removal of the unaffected, damaged tube and understands I would still remove the affected tube.  Her hgb was normal and she is at risk for continued bleeding. She was consented for pRBC - risks discussed including transfusion reaction and contracting Hepatitis or HIV. She consent to blood if it is deemed medically appropriate.  She is RH + and does not require rhogam.  She will be treated for Trichomoniasas after surgery as an outpatient. Will go ahead and give 1000mg  IV given surgery now.  Will give the remaining needed 1000mg  as PO tablet. Risks of trich d/w pt. Partner to be treated as well.    12/22/2020, 12:11  PM

## 2020-12-22 NOTE — Anesthesia Preprocedure Evaluation (Addendum)
Anesthesia Evaluation  Patient identified by MRN, date of birth, ID band Patient awake    Reviewed: Allergy & Precautions, NPO status , Patient's Chart, lab work & pertinent test results  Airway Mallampati: II  TM Distance: >3 FB Neck ROM: Full    Dental no notable dental hx.    Pulmonary Current Smoker and Patient abstained from smoking.,    Pulmonary exam normal breath sounds clear to auscultation       Cardiovascular negative cardio ROS Normal cardiovascular exam Rhythm:Regular Rate:Normal  ECG: NSR, rate 94   Neuro/Psych Seizures -, Well Controlled,  negative psych ROS   GI/Hepatic negative GI ROS, Neg liver ROS,   Endo/Other  negative endocrine ROS  Renal/GU negative Renal ROS     Musculoskeletal Scoliosis   Abdominal   Peds  Hematology negative hematology ROS (+)   Anesthesia Other Findings RLQ abdominal pain Ectopic pregnancy   Reproductive/Obstetrics                            Anesthesia Physical Anesthesia Plan  ASA: 2 and emergent  Anesthesia Plan: General   Post-op Pain Management:    Induction: Intravenous  PONV Risk Score and Plan: 2 and Ondansetron, Dexamethasone, Midazolam, Treatment may vary due to age or medical condition and Scopolamine patch - Pre-op  Airway Management Planned: Oral ETT  Additional Equipment:   Intra-op Plan:   Post-operative Plan: Extubation in OR  Informed Consent: I have reviewed the patients History and Physical, chart, labs and discussed the procedure including the risks, benefits and alternatives for the proposed anesthesia with the patient or authorized representative who has indicated his/her understanding and acceptance.     Dental advisory given  Plan Discussed with: CRNA  Anesthesia Plan Comments:        Anesthesia Quick Evaluation

## 2020-12-22 NOTE — Transfer of Care (Signed)
Immediate Anesthesia Transfer of Care Note  Patient: [name]  Procedure(s) Performed: ATTEMPTED DIAGNOSTIC LAPAROSCOPY (Abdomen) EXPLORATORY LAPAROTOMY WITH REMOVAL OF ECTOPIC PREGNANCY (Abdomen) UNILATERAL SALPINGECTOMY (Right: Abdomen)  Patient Location: PACU  Anesthesia Type:General  Level of Consciousness: awake, alert  and patient cooperative  Airway & Oxygen Therapy: Patient Spontanous Breathing and Patient connected to face mask oxygen  Post-op Assessment: Report given to RN, Post -op Vital signs reviewed and stable and Patient moving all extremities  Post vital signs: Reviewed and stable  Last Vitals:  Vitals Value Taken Time  BP    Temp    Pulse 84 12/22/20 1452  Resp 24 12/22/20 1452  SpO2 93 % 12/22/20 1452  Vitals shown include unvalidated device data.  Last Pain:  Vitals:   12/22/20 1140  TempSrc:   PainSc: 0-No pain         Complications: No notable events documented.

## 2020-12-22 NOTE — Anesthesia Postprocedure Evaluation (Signed)
Anesthesia Post Note  Patient: [name]  Procedure(s) Performed: ATTEMPTED DIAGNOSTIC LAPAROSCOPY (Abdomen) EXPLORATORY LAPAROTOMY WITH REMOVAL OF ECTOPIC PREGNANCY (Abdomen) UNILATERAL SALPINGECTOMY (Right: Abdomen)     Patient location during evaluation: PACU Anesthesia Type: General Level of consciousness: awake Pain management: pain level controlled Vital Signs Assessment: post-procedure vital signs reviewed and stable Respiratory status: spontaneous breathing, nonlabored ventilation, respiratory function stable and patient connected to nasal cannula oxygen Cardiovascular status: blood pressure returned to baseline and stable Postop Assessment: no apparent nausea or vomiting Anesthetic complications: no   No notable events documented.  Last Vitals:  Vitals:   12/22/20 1634 12/22/20 1649  BP: (!) 91/52 106/69  Pulse: 76 88  Resp: 15 18  Temp:  37.2 C  SpO2: 94% 100%    Last Pain:  Vitals:   12/22/20 1649  TempSrc:   PainSc: Asleep                 Bruchy Mikel P Franziska Podgurski

## 2020-12-22 NOTE — Anesthesia Procedure Notes (Signed)
Procedure Name: Intubation Date/Time: 12/22/2020 1:19 PM Performed by: Catalina Gravel, MD Pre-anesthesia Checklist: Patient identified, Suction available and Patient being monitored Patient Re-evaluated:Patient Re-evaluated prior to induction Oxygen Delivery Method: Circle system utilized Preoxygenation: Pre-oxygenation with 100% oxygen Induction Type: IV induction and Rapid sequence Ventilation: Mask ventilation without difficulty Laryngoscope Size: Mac and 3 Grade View: Grade II Tube type: Oral Tube size: 7.0 mm Number of attempts: 3 Airway Equipment and Method: Stylet and Oral airway Placement Confirmation: ETT inserted through vocal cords under direct vision, positive ETCO2 and breath sounds checked- equal and bilateral Secured at: 23 cm Tube secured with: Tape Dental Injury: Teeth and Oropharynx as per pre-operative assessment  Difficulty Due To: Difficulty was unanticipated and Difficult Airway- due to limited oral opening Future Recommendations: Recommend- induction with short-acting agent, and alternative techniques readily available Comments: Attempt x1 by SRNA with Miller 2 without success.  Attempt x1 by MDA with MAC 3 with Grade 4 view.  Neck pillow and head pillow removed, patient repositioned, and second attempt by MDA with Grade 2 view, ETT passed through glottis under direct visualization. +BBS, +EtCO2.  Collins Scotland, MD

## 2020-12-23 ENCOUNTER — Encounter (HOSPITAL_COMMUNITY): Payer: Self-pay | Admitting: Obstetrics and Gynecology

## 2020-12-23 LAB — GC/CHLAMYDIA PROBE AMP (~~LOC~~) NOT AT ARMC
Chlamydia: NEGATIVE
Comment: NEGATIVE
Comment: NORMAL
Neisseria Gonorrhea: NEGATIVE

## 2020-12-24 LAB — SURGICAL PATHOLOGY

## 2020-12-26 ENCOUNTER — Inpatient Hospital Stay (HOSPITAL_COMMUNITY)
Admission: AD | Admit: 2020-12-26 | Discharge: 2020-12-29 | DRG: 769 | Disposition: A | Discharge status: Home / Self Care | Payer: 59 | Attending: Obstetrics and Gynecology | Admitting: Obstetrics and Gynecology

## 2020-12-26 ENCOUNTER — Other Ambulatory Visit: Payer: Self-pay

## 2020-12-26 ENCOUNTER — Encounter (HOSPITAL_COMMUNITY): Payer: Self-pay | Admitting: Obstetrics and Gynecology

## 2020-12-26 ENCOUNTER — Inpatient Hospital Stay (HOSPITAL_COMMUNITY): Payer: 59

## 2020-12-26 DIAGNOSIS — F17290 Nicotine dependence, other tobacco product, uncomplicated: Secondary | ICD-10-CM | POA: Diagnosis not present

## 2020-12-26 DIAGNOSIS — O080 Genital tract and pelvic infection following ectopic and molar pregnancy: Secondary | ICD-10-CM | POA: Diagnosis not present

## 2020-12-26 DIAGNOSIS — R1032 Left lower quadrant pain: Secondary | ICD-10-CM | POA: Diagnosis present

## 2020-12-26 DIAGNOSIS — D72829 Elevated white blood cell count, unspecified: Secondary | ICD-10-CM | POA: Diagnosis not present

## 2020-12-26 DIAGNOSIS — F17210 Nicotine dependence, cigarettes, uncomplicated: Secondary | ICD-10-CM | POA: Diagnosis not present

## 2020-12-26 DIAGNOSIS — N719 Inflammatory disease of uterus, unspecified: Secondary | ICD-10-CM | POA: Diagnosis present

## 2020-12-26 MED ORDER — CLINDAMYCIN PHOSPHATE 900 MG/50ML IV SOLN
900.0000 mg | Freq: Three times a day (TID) | INTRAVENOUS | Status: DC
  Filled 2020-12-26 (×2): qty 50

## 2020-12-26 MED ORDER — SODIUM CHLORIDE 0.9 % IV SOLN
2.0000 g | Freq: Three times a day (TID) | INTRAVENOUS | Status: DC
  Administered 2020-12-27 – 2020-12-29 (×8): 2 g via INTRAVENOUS
  Filled 2020-12-26 (×8): qty 2000

## 2020-12-26 MED ORDER — GENTAMICIN SULFATE 40 MG/ML IJ SOLN
5.0000 mg/kg | INTRAVENOUS | Status: DC
  Administered 2020-12-27 – 2020-12-29 (×3): 260 mg via INTRAVENOUS
  Filled 2020-12-26 (×3): qty 6.5

## 2020-12-26 NOTE — MAU Note (Signed)
.  [name] is a 30 y.o. at Unknown here in MAU reporting: lower right back and abdominal constant pain. Pain started today around 2130-2200. Pt is also nauseous.   Onset of complaint: 12/26/20 Pain score: 10/10 There were no vitals filed for this visit.    Lab orders placed from triage:  UA

## 2020-12-26 NOTE — H&P (Signed)
[name] is an 30 y.o. female. 30 yo G3P1020 POD#4 presenting with LLQ pain of sudden onset. She underwent a DSC LSC with conversion to mini lap s/s large (9 week) right sided ectopic pregnancy on 9/6. At that time she had an HCG of >70k. SHe was discharged that day in good condition with plans for HCG fu. TVUS prior to surgery demonstrated a right sided ectopic, normal left ovary and adnexa, and "normal uterus". However, upon inspecting the images myself, I noted she had a thickened EMS to 2cm but no overt masses, pregnancy, or polyps. She did have mild bleeding at that time. Since surgery, she was felt well. She passed gas on Friday. However, she stopped passing gas at that time and has not had any flatus.   She has had brown discharge but no overt vaginal bleeding. She remains on pelvic rest. Her vitals in the ED were all reassuring and without evidence of hypotension or persistent tachycardia.  Labs done so far: WBC 13 Hgb 12HCG 2,893HCG 2k    EXAM: CT ABDOMEN AND PELVIS WITH CONTRAST  TECHNIQUE: Multidetector CT imaging of the abdomen and pelvis was performed using the standard protocol following bolus administration of intravenous contrast.  CONTRAST: 80 mL Omnipaque 350  COMPARISON: CT abdomen/pelvis 11/25/2016  FINDINGS: Lower chest: No acute abnormality.  Hepatobiliary: No focal liver abnormality is seen. No gallstones, gallbladder wall thickening, or biliary dilatation.  Pancreas: Unremarkable. No pancreatic ductal dilatation or surrounding inflammatory changes.  Spleen: Normal in size without focal abnormality.  Adrenals/Urinary Tract: Adrenal glands are unremarkable. Kidneys are normal, without renal calculi, focal lesion, or hydronephrosis. Bladder is unremarkable.  Stomach/Bowel: Stomach is within normal limits. Appendix appears normal. No evidence of bowel wall thickening, distention, or inflammatory changes.  Vascular/Lymphatic: No significant vascular  findings are present. No enlarged abdominal or pelvic lymph nodes.  Reproductive: Ill-defined soft tissue in the region of the adnexa. The endometrial canal is markedly dilated and filled with heterogeneous material, particularly in the lower uterine segment extending into the dilated cervix. Low-attenuation cyst affiliated with the right adnexa, presumably a follicle. No large free fluid.  Other: No abdominal wall hernia or abnormality. No abdominopelvic ascites.  Musculoskeletal: S shaped scoliosis. No acute fracture.  IMPRESSION: 1. Ill-defined soft tissue density and stranding in the low anatomic pelvis is likely postsurgical in nature given history of recent right salpingectomy. No evidence of free fluid or abscess at this time. 2. Markedly distended endometrial canal, particularly in the lower uterine segment and extending into the dilated cervix both of which are filled with heterogeneous echogenic material. If the patient is actively menstruating, this may simply represent passage of menstrual flow.  Electronically Signed By: Malachy Moan M.D. On: 12/26/2020 15:47  US PELVIS COMPLETE TRANSABD TRANSVAG W D  Narrative  CLINICAL DATA: Left lower quadrant abdomen pain.  EXAM: TRANSABDOMINAL AND TRANSVAGINAL ULTRASOUND OF PELVIS  DOPPLER ULTRASOUND OF OVARIES  TECHNIQUE: Both transabdominal and transvaginal ultrasound examinations of the pelvis were performed. Transabdominal technique was performed for global imaging of the pelvis including uterus, ovaries, adnexal regions, and pelvic cul-de-sac.  It was necessary to proceed with endovaginal exam following the transabdominal exam to visualize the ovaries. Color and duplex Doppler ultrasound was utilized to evaluate blood flow to the ovaries.  COMPARISON: Ob ultrasound dated 12/22/2020  FINDINGS: Uterus  Measurements: 9.8 x 5.3 x 4.8 cm = volume: 130 mL.Marland Kitchen No fibroids or other mass  visualized.  Endometrium  Thickness: 8.3 mm. Tiny amount of fluid in  the endometrial cavity.  Right ovary  Measurements: 3.7 x 2.8 x 2.5 cm = volume: 13.5 mL. 2.4 cm follicle.  Left ovary  Measurements: 3.4 x 2.2 x 2.2 cm = volume: 8.8 mL. Normal appearance/no adnexal mass.  Pulsed Doppler evaluation of both ovaries demonstrates normal low-resistance arterial and venous waveforms.  Other findings  No abnormal free fluid.  IMPRESSION: Normal examination.  Electronically Signed By: Beckie Salts M.D. On: 12/26/2020 13:56  After [place] report seen and CT reviewed, the [place] report was amended to reflect what the CT scan showed which is that there is a large heterogeneous area in the upper cervix extending down to the internal os. This measures 2.1 cm and is the same echogenicity of the known thinned EMS. This is most c/w a large blood clot  Pertinent Gynecological History: Menses: flow is moderate Bleeding: intermenstrual bleeding Contraception: none DES exposure: denies Blood transfusions: none Sexually transmitted diseases: recent diagnosis: trichomoniasis.  Previous GYN Procedures:  none   OB History: G3, P1010 G1: SAB G2: term delivery with neonatal demise. Per patient, she reports getting routine OB care but the baby was born w/out lung maturation and he passed after 12 hours. Went into labor on her own.    Menstrual History No LMP recorded. (Menstrual status: Irregular Periods).    Past Medical History:  Diagnosis Date   Epiglottitis 06/29/2018   Scoliosis    Seizures (HCC)     Past Surgical History:  Procedure Laterality Date   DIAGNOSTIC LAPAROSCOPY WITH REMOVAL OF ECTOPIC PREGNANCY N/A 12/22/2020   Procedure: ATTEMPTED DIAGNOSTIC LAPAROSCOPY;  Surgeon: Ranae Pila, MD;  Location: [facility] OR;  Service: Gynecology;  Laterality: N/A;   LAPAROTOMY N/A 12/22/2020   Procedure: EXPLORATORY LAPAROTOMY WITH REMOVAL OF ECTOPIC PREGNANCY;  Surgeon: Ranae Pila,  MD;  Location: [facility] OR;  Service: Gynecology;  Laterality: N/A;   NO PAST SURGERIES     UNILATERAL SALPINGECTOMY Right 12/22/2020   Procedure: UNILATERAL SALPINGECTOMY;  Surgeon: Ranae Pila, MD;  Location[facility] OR;  Service: Gynecology;  Laterality: Right;    Family History  Family history unknown: Yes    Social History:  reports that she has been smoking cigars and cigarettes. She has a 9.00 pack-year smoking history. She has never used smokeless tobacco. She reports current alcohol use. She reports that she does not currently use drugs after having used the following drugs: Marijuana.  Allergies:  Allergies  Allergen Reactions   Peanut-Containing Drug Products Anaphylaxis    Medications Prior to Admission  Medication Sig Dispense Refill Last Dose   ibuprofen (ADVIL) 800 MG tablet Take 1 tablet (800 mg total) by mouth every 8 (eight) hours as needed. 30 tablet 0 12/26/2020   oxyCODONE (OXY IR/ROXICODONE) 5 MG immediate release tablet Take 1 tablet (5 mg total) by mouth every 6 (six) hours as needed for severe pain. 15 tablet 0 12/26/2020   albuterol (VENTOLIN HFA) 108 (90 Base) MCG/ACT inhaler Inhale 1-2 puffs into the lungs every 6 (six) hours as needed for wheezing or shortness of breath. 16 g 0    docusate sodium (COLACE) 100 MG capsule Take 1 capsule (100 mg total) by mouth 2 (two) times daily. 60 capsule 2    fluticasone (FLONASE) 50 MCG/ACT nasal spray Place 2 sprays into both nostrils daily for 14 days. 11.1 mL 0    metroNIDAZOLE (FLAGYL) 500 MG tablet Take two tablets at one time with food. 2 tablet 0     Review of Systems  Blood  pressure 122/75, pulse 95, temperature 98.7 F (37.1 C), temperature source Oral, resp. rate 16, SpO2 96 %. Physical Exam Gen: well appearing, NAD CV: Reg rate Pulm: NWOB Abd: distended, tender mostly in suprapubic region although no rebound/guarding, no masses. + bs in right but hypoactive in the left GYN: uterus tender, cervix 1cm dilated  with dense clot stuck in the os. Clot grasped with a ring and ~ 5cm of thick, foul smelling old clot/tissue removed with rings.  Ext: No edema b/l   No results found for this or any previous visit (from the past 24 hour(s)).   No results found.  Assessment/Plan: 30 yo with POD#4 with suspected endometritis and retained, infected blood clot in the setting of recent trichomoniasis infection.   Distended abdomen.  However, CT without evidence of abscess, inflammatory changes to suggest a bowel injury, abscess, ilues, or SBO.  KUB done given hypoactive bs. Suspect endometritis/PID s/s retained blood clots. Amp/gent/flagyl IV BCX x 2 TO the OR now for D&C for presumed septic endometritis, possible diagnostic laparoscopy, possible laparotomy, and any additional procedures. Risks discussed including infection, bleeding, damage to surrounding structures, need for additional procedures, postoperative DVT. All questions answered. Consent signe. General surgery was called and feels that given normal CT scan, ileus or SBO is unlikely.    Ranae Pila 12/26/2020, 11:58 PM

## 2020-12-27 ENCOUNTER — Inpatient Hospital Stay (HOSPITAL_COMMUNITY): Payer: 59 | Admitting: Anesthesiology

## 2020-12-27 ENCOUNTER — Encounter (HOSPITAL_COMMUNITY)
Admission: AD | Disposition: A | Discharge status: Home / Self Care | Payer: Self-pay | Source: Home / Self Care | Attending: Obstetrics and Gynecology

## 2020-12-27 ENCOUNTER — Encounter (HOSPITAL_COMMUNITY): Payer: Self-pay | Admitting: Obstetrics and Gynecology

## 2020-12-27 ENCOUNTER — Inpatient Hospital Stay (HOSPITAL_COMMUNITY): Payer: 59

## 2020-12-27 DIAGNOSIS — O080 Genital tract and pelvic infection following ectopic and molar pregnancy: Secondary | ICD-10-CM | POA: Diagnosis present

## 2020-12-27 DIAGNOSIS — F17290 Nicotine dependence, other tobacco product, uncomplicated: Secondary | ICD-10-CM | POA: Diagnosis present

## 2020-12-27 DIAGNOSIS — R1032 Left lower quadrant pain: Secondary | ICD-10-CM | POA: Diagnosis present

## 2020-12-27 DIAGNOSIS — F17210 Nicotine dependence, cigarettes, uncomplicated: Secondary | ICD-10-CM | POA: Diagnosis present

## 2020-12-27 DIAGNOSIS — N719 Inflammatory disease of uterus, unspecified: Secondary | ICD-10-CM | POA: Diagnosis present

## 2020-12-27 DIAGNOSIS — D72829 Elevated white blood cell count, unspecified: Secondary | ICD-10-CM | POA: Diagnosis present

## 2020-12-27 HISTORY — PX: DILATION AND EVACUATION: SHX1459

## 2020-12-27 HISTORY — PX: LAPAROSCOPY: SHX197

## 2020-12-27 HISTORY — PX: LAPAROSCOPIC LYSIS OF ADHESIONS: SHX5905

## 2020-12-27 LAB — CBC WITH DIFFERENTIAL/PLATELET
Abs Immature Granulocytes: 0.07 10*3/uL (ref 0.00–0.07)
Basophils Absolute: 0.1 10*3/uL (ref 0.0–0.1)
Basophils Relative: 0 %
Eosinophils Absolute: 0.1 10*3/uL (ref 0.0–0.5)
Eosinophils Relative: 1 %
HCT: 32.8 % — ABNORMAL LOW (ref 36.0–46.0)
Hemoglobin: 11.6 g/dL — ABNORMAL LOW (ref 12.0–15.0)
Immature Granulocytes: 1 %
Lymphocytes Relative: 11 %
Lymphs Abs: 1.6 10*3/uL (ref 0.7–4.0)
MCH: 30.7 pg (ref 26.0–34.0)
MCHC: 35.4 g/dL (ref 30.0–36.0)
MCV: 86.8 fL (ref 80.0–100.0)
Monocytes Absolute: 1.2 10*3/uL — ABNORMAL HIGH (ref 0.1–1.0)
Monocytes Relative: 9 %
Neutro Abs: 10.6 10*3/uL — ABNORMAL HIGH (ref 1.7–7.7)
Neutrophils Relative %: 78 %
Platelets: 268 10*3/uL (ref 150–400)
RBC: 3.78 MIL/uL — ABNORMAL LOW (ref 3.87–5.11)
RDW: 12.4 % (ref 11.5–15.5)
WBC: 13.5 10*3/uL — ABNORMAL HIGH (ref 4.0–10.5)
nRBC: 0 % (ref 0.0–0.2)

## 2020-12-27 LAB — TYPE AND SCREEN
ABO/RH(D): A POS
Antibody Screen: NEGATIVE

## 2020-12-27 LAB — CBC
HCT: 31.7 % — ABNORMAL LOW (ref 36.0–46.0)
Hemoglobin: 11.3 g/dL — ABNORMAL LOW (ref 12.0–15.0)
MCH: 30.9 pg (ref 26.0–34.0)
MCHC: 35.6 g/dL (ref 30.0–36.0)
MCV: 86.6 fL (ref 80.0–100.0)
Platelets: 284 10*3/uL (ref 150–400)
RBC: 3.66 MIL/uL — ABNORMAL LOW (ref 3.87–5.11)
RDW: 12.5 % (ref 11.5–15.5)
WBC: 16.2 10*3/uL — ABNORMAL HIGH (ref 4.0–10.5)
nRBC: 0 % (ref 0.0–0.2)

## 2020-12-27 LAB — DIC (DISSEMINATED INTRAVASCULAR COAGULATION)PANEL
D-Dimer, Quant: 2.1 ug/mL-FEU — ABNORMAL HIGH (ref 0.00–0.50)
Fibrinogen: 420 mg/dL (ref 210–475)
INR: 1.1 (ref 0.8–1.2)
Platelets: 289 10*3/uL (ref 150–400)
Prothrombin Time: 14.5 seconds (ref 11.4–15.2)
Smear Review: NONE SEEN
aPTT: 30 seconds (ref 24–36)

## 2020-12-27 SURGERY — DILATION AND EVACUATION, UTERUS
Anesthesia: General

## 2020-12-27 MED ORDER — KETOROLAC TROMETHAMINE 30 MG/ML IJ SOLN
30.0000 mg | Freq: Four times a day (QID) | INTRAMUSCULAR | Status: AC
  Administered 2020-12-27 (×4): 30 mg via INTRAVENOUS
  Filled 2020-12-27 (×4): qty 1

## 2020-12-27 MED ORDER — MIDAZOLAM HCL 2 MG/2ML IJ SOLN
INTRAMUSCULAR | Status: AC
  Filled 2020-12-27: qty 2

## 2020-12-27 MED ORDER — MIDAZOLAM HCL 5 MG/5ML IJ SOLN
INTRAMUSCULAR | Status: DC | PRN
  Administered 2020-12-27: 2 mg via INTRAVENOUS

## 2020-12-27 MED ORDER — KETOROLAC TROMETHAMINE 30 MG/ML IJ SOLN: 30.0000 mg | Freq: Once | INTRAMUSCULAR | Status: DC

## 2020-12-27 MED ORDER — FENTANYL CITRATE (PF) 100 MCG/2ML IJ SOLN: 25.0000 ug | INTRAMUSCULAR | Status: DC | PRN

## 2020-12-27 MED ORDER — ALUM & MAG HYDROXIDE-SIMETH 200-200-20 MG/5ML PO SUSP: 30.0000 mL | ORAL | Status: DC | PRN

## 2020-12-27 MED ORDER — ACETAMINOPHEN 500 MG PO TABS
1000.0000 mg | ORAL_TABLET | Freq: Four times a day (QID) | ORAL | Status: DC
  Administered 2020-12-27 – 2020-12-29 (×8): 1000 mg via ORAL
  Filled 2020-12-27 (×8): qty 2

## 2020-12-27 MED ORDER — ZOLPIDEM TARTRATE 5 MG PO TABS: 5.0000 mg | ORAL_TABLET | Freq: Every evening | ORAL | Status: DC | PRN

## 2020-12-27 MED ORDER — IBUPROFEN 600 MG PO TABS
600.0000 mg | ORAL_TABLET | Freq: Four times a day (QID) | ORAL | Status: DC
  Administered 2020-12-28 – 2020-12-29 (×5): 600 mg via ORAL
  Filled 2020-12-27 (×5): qty 1

## 2020-12-27 MED ORDER — SIMETHICONE 80 MG PO CHEW: 80.0000 mg | CHEWABLE_TABLET | Freq: Four times a day (QID) | ORAL | Status: DC | PRN

## 2020-12-27 MED ORDER — ONDANSETRON HCL 4 MG PO TABS: 4.0000 mg | ORAL_TABLET | Freq: Four times a day (QID) | ORAL | Status: DC | PRN

## 2020-12-27 MED ORDER — ACETAMINOPHEN 160 MG/5ML PO SOLN: 325.0000 mg | ORAL | Status: DC | PRN

## 2020-12-27 MED ORDER — PANTOPRAZOLE SODIUM 40 MG PO TBEC
40.0000 mg | DELAYED_RELEASE_TABLET | Freq: Every day | ORAL | Status: DC
  Administered 2020-12-27 – 2020-12-28 (×2): 40 mg via ORAL
  Filled 2020-12-27 (×2): qty 1

## 2020-12-27 MED ORDER — HYDROMORPHONE HCL 1 MG/ML IJ SOLN: 0.2000 mg | INTRAMUSCULAR | Status: DC | PRN

## 2020-12-27 MED ORDER — SUCCINYLCHOLINE CHLORIDE 200 MG/10ML IV SOSY
PREFILLED_SYRINGE | INTRAVENOUS | Status: DC | PRN
  Administered 2020-12-27: 120 mg via INTRAVENOUS

## 2020-12-27 MED ORDER — OXYCODONE HCL 5 MG PO TABS: 5.0000 mg | ORAL_TABLET | Freq: Once | ORAL | Status: DC | PRN

## 2020-12-27 MED ORDER — SUGAMMADEX SODIUM 200 MG/2ML IV SOLN
INTRAVENOUS | Status: DC | PRN
  Administered 2020-12-27: 200 mg via INTRAVENOUS

## 2020-12-27 MED ORDER — DEXTROSE-NACL 5-0.45 % IV SOLN
125.0000 mL/h | INTRAVENOUS | Status: AC
  Administered 2020-12-27 (×2): 125 mL/h via INTRAVENOUS

## 2020-12-27 MED ORDER — AMISULPRIDE (ANTIEMETIC) 5 MG/2ML IV SOLN: 10.0000 mg | Freq: Once | INTRAVENOUS | Status: DC | PRN

## 2020-12-27 MED ORDER — FENTANYL CITRATE (PF) 100 MCG/2ML IJ SOLN
INTRAMUSCULAR | Status: DC | PRN
  Administered 2020-12-27 (×4): 50 ug via INTRAVENOUS

## 2020-12-27 MED ORDER — PROPOFOL 10 MG/ML IV BOLUS
INTRAVENOUS | Status: DC | PRN
  Administered 2020-12-27: 150 mg via INTRAVENOUS

## 2020-12-27 MED ORDER — ONDANSETRON HCL 4 MG/2ML IJ SOLN: 4.0000 mg | Freq: Four times a day (QID) | INTRAMUSCULAR | Status: DC | PRN

## 2020-12-27 MED ORDER — GABAPENTIN 100 MG PO CAPS
100.0000 mg | ORAL_CAPSULE | Freq: Once | ORAL | Status: AC
  Administered 2020-12-27: 100 mg via ORAL
  Filled 2020-12-27: qty 1

## 2020-12-27 MED ORDER — DEXAMETHASONE SODIUM PHOSPHATE 10 MG/ML IJ SOLN
INTRAMUSCULAR | Status: DC | PRN
  Administered 2020-12-27: 10 mg via INTRAVENOUS

## 2020-12-27 MED ORDER — LACTATED RINGERS IV SOLN: INTRAVENOUS | Status: DC | PRN

## 2020-12-27 MED ORDER — LIDOCAINE 2% (20 MG/ML) 5 ML SYRINGE
INTRAMUSCULAR | Status: DC | PRN
  Administered 2020-12-27: 40 mg via INTRAVENOUS

## 2020-12-27 MED ORDER — OXYCODONE HCL 5 MG PO TABS: 5.0000 mg | ORAL_TABLET | ORAL | Status: DC | PRN

## 2020-12-27 MED ORDER — ACETAMINOPHEN 10 MG/ML IV SOLN: 1000.0000 mg | Freq: Once | INTRAVENOUS | Status: DC | PRN

## 2020-12-27 MED ORDER — SENNOSIDES-DOCUSATE SODIUM 8.6-50 MG PO TABS: 1.0000 | ORAL_TABLET | Freq: Every evening | ORAL | Status: DC | PRN

## 2020-12-27 MED ORDER — DOCUSATE SODIUM 100 MG PO CAPS
100.0000 mg | ORAL_CAPSULE | Freq: Two times a day (BID) | ORAL | Status: DC
  Administered 2020-12-27 – 2020-12-28 (×4): 100 mg via ORAL
  Filled 2020-12-27 (×4): qty 1

## 2020-12-27 MED ORDER — ACETAMINOPHEN 325 MG PO TABS: 325.0000 mg | ORAL_TABLET | ORAL | Status: DC | PRN

## 2020-12-27 MED ORDER — PHENYLEPHRINE HCL (PRESSORS) 10 MG/ML IV SOLN
INTRAVENOUS | Status: DC | PRN
  Administered 2020-12-27: 40 ug via INTRAVENOUS

## 2020-12-27 MED ORDER — CHLOROPROCAINE HCL 1 % IJ SOLN
INTRAMUSCULAR | Status: DC | PRN
  Administered 2020-12-27: 8 mL

## 2020-12-27 MED ORDER — METRONIDAZOLE 500 MG/100ML IV SOLN
500.0000 mg | Freq: Three times a day (TID) | INTRAVENOUS | Status: DC
  Administered 2020-12-27 – 2020-12-29 (×7): 500 mg via INTRAVENOUS
  Filled 2020-12-27 (×10): qty 100

## 2020-12-27 MED ORDER — ONDANSETRON HCL 4 MG/2ML IJ SOLN
INTRAMUSCULAR | Status: DC | PRN
  Administered 2020-12-27: 4 mg via INTRAVENOUS

## 2020-12-27 MED ORDER — PROPOFOL 10 MG/ML IV BOLUS
INTRAVENOUS | Status: AC
  Filled 2020-12-27: qty 20

## 2020-12-27 MED ORDER — ROCURONIUM BROMIDE 10 MG/ML (PF) SYRINGE
PREFILLED_SYRINGE | INTRAVENOUS | Status: DC | PRN
Start: 2020-12-27 — End: 2020-12-27
  Administered 2020-12-27: 40 mg via INTRAVENOUS
  Administered 2020-12-27: 10 mg via INTRAVENOUS

## 2020-12-27 MED ORDER — OXYCODONE HCL 5 MG/5ML PO SOLN: 5.0000 mg | Freq: Once | ORAL | Status: DC | PRN

## 2020-12-27 MED ORDER — FENTANYL CITRATE (PF) 250 MCG/5ML IJ SOLN
INTRAMUSCULAR | Status: AC
  Filled 2020-12-27: qty 5

## 2020-12-27 MED ORDER — BUPIVACAINE HCL 0.25 % IJ SOLN
INTRAMUSCULAR | Status: DC | PRN
  Administered 2020-12-27: 6 mL

## 2020-12-27 MED ORDER — PROMETHAZINE HCL 25 MG/ML IJ SOLN: 6.2500 mg | INTRAMUSCULAR | Status: DC | PRN

## 2020-12-27 MED ORDER — CHLOROPROCAINE HCL 1 % IJ SOLN
INTRAMUSCULAR | Status: AC
  Filled 2020-12-27: qty 30

## 2020-12-27 SURGICAL SUPPLY — 41 items
COVER BACK TABLE 60X90IN (DRAPES) ×3 IMPLANT
COVER MAYO STAND STRL (DRAPES) ×3 IMPLANT
DERMABOND ADHESIVE PROPEN (GAUZE/BANDAGES/DRESSINGS) ×1
DERMABOND ADVANCED (GAUZE/BANDAGES/DRESSINGS) ×1
DERMABOND ADVANCED .7 DNX12 (GAUZE/BANDAGES/DRESSINGS) ×2 IMPLANT
DERMABOND ADVANCED .7 DNX6 (GAUZE/BANDAGES/DRESSINGS) ×2 IMPLANT
DRAPE UTILITY 15X26 TOWEL STRL (DRAPES) ×3 IMPLANT
FILTER UTR ASPR ASSEMBLY (MISCELLANEOUS) ×3 IMPLANT
GAUZE 4X4 16PLY ~~LOC~~+RFID DBL (SPONGE) ×3 IMPLANT
GLOVE SURG ENC MOIS LTX SZ6.5 (GLOVE) ×6 IMPLANT
GLOVE SURG UNDER POLY LF SZ7 (GLOVE) ×18 IMPLANT
GOWN STRL REUS W/ TWL LRG LVL3 (GOWN DISPOSABLE) ×4 IMPLANT
GOWN STRL REUS W/TWL LRG LVL3 (GOWN DISPOSABLE) ×2
HOSE CONNECTING 18IN BERKELEY (TUBING) ×3 IMPLANT
IRRIG SUCT STRYKERFLOW 2 WTIP (MISCELLANEOUS) ×3
IRRIGATION SUCT STRKRFLW 2 WTP (MISCELLANEOUS) ×2 IMPLANT
KIT BERKELEY 1ST TRI 3/8 NO TR (MISCELLANEOUS) ×3 IMPLANT
KIT BERKELEY 1ST TRIMESTER 3/8 (MISCELLANEOUS) ×3 IMPLANT
KIT TURNOVER KIT B (KITS) ×3 IMPLANT
LIGASURE LAP L-HOOKWIRE 5 44CM (INSTRUMENTS) ×3 IMPLANT
NEEDLE INSUFFLATION 14GA 120MM (NEEDLE) ×3 IMPLANT
NS IRRIG 1000ML POUR BTL (IV SOLUTION) ×3 IMPLANT
PACK TRENDGUARD 450 HYBRID PRO (MISCELLANEOUS) ×2 IMPLANT
PACK VAGINAL MINOR WOMEN LF (CUSTOM PROCEDURE TRAY) ×3 IMPLANT
PAD OB MATERNITY 4.3X12.25 (PERSONAL CARE ITEMS) ×3 IMPLANT
PROTECTOR NERVE ULNAR (MISCELLANEOUS) ×3 IMPLANT
SET BERKELEY SUCTION TUBING (SUCTIONS) ×3 IMPLANT
SET TUBE SMOKE EVAC HIGH FLOW (TUBING) ×3 IMPLANT
SHEARS HARMONIC ACE PLUS 36CM (ENDOMECHANICALS) ×3 IMPLANT
SLEEVE ENDOPATH XCEL 5M (ENDOMECHANICALS) ×3 IMPLANT
SOL ANTI FOG 6CC (MISCELLANEOUS) ×2 IMPLANT
SOLUTION ANTI FOG 6CC (MISCELLANEOUS) ×1
SUT VIC AB 4-0 PS2 27 (SUTURE) ×3 IMPLANT
SUT VICRYL 0 UR6 27IN ABS (SUTURE) ×6 IMPLANT
SYR CONTROL 10ML LL (SYRINGE) ×3 IMPLANT
TOWEL GREEN STERILE FF (TOWEL DISPOSABLE) ×6 IMPLANT
TRENDGUARD 450 HYBRID PRO PACK (MISCELLANEOUS) ×3
TROCAR BALLN 12MMX100 BLUNT (TROCAR) ×3 IMPLANT
TROCAR XCEL NON-BLD 5MMX100MML (ENDOMECHANICALS) ×3 IMPLANT
UNDERPAD 30X36 HEAVY ABSORB (UNDERPADS AND DIAPERS) ×3 IMPLANT
VACURETTE 7MM CVD STRL WRAP (CANNULA) ×3 IMPLANT

## 2020-12-27 NOTE — Anesthesia Procedure Notes (Signed)
Procedure Name: Intubation Date/Time: 12/27/2020 1:12 AM Performed by: Edmonia Caprio, CRNA Pre-anesthesia Checklist: Patient identified, Emergency Drugs available, Suction available and Patient being monitored Patient Re-evaluated:Patient Re-evaluated prior to induction Oxygen Delivery Method: Circle system utilized Preoxygenation: Pre-oxygenation with 100% oxygen Induction Type: IV induction, Rapid sequence and Cricoid Pressure applied Laryngoscope Size: Glidescope and 3 Grade View: Grade I Tube type: Oral Tube size: 7.0 mm Number of attempts: 1 Airway Equipment and Method: Stylet and Video-laryngoscopy Placement Confirmation: ETT inserted through vocal cords under direct vision, positive ETCO2 and breath sounds checked- equal and bilateral Secured at: 24 cm Tube secured with: Tape Dental Injury: Teeth and Oropharynx as per pre-operative assessment

## 2020-12-27 NOTE — Progress Notes (Signed)
Day of Surgery Procedure(s) (LRB): DILATATION AND EVACUATION (N/A) LAPAROSCOPY DIAGNOSTIC LAPAROSCOPIC LYSIS OF ADHESIONS  Subjective: Patient reports incisional pain, tolerating PO, and no problems voiding.  While still has some mild pain, it is GREATLY improved - pt feeling so much better. Passing copious flatus now.   Objective: I have reviewed patient's vital signs, intake and output, medications, labs, and microbiology. She is smiling, conversive, and in NAD General: alert and cooperative Resp: clear to auscultation bilaterally Cardio: regular rate and rhythm, S1, S2 normal, no murmur, click, rub or gallop GI: abnormal findings:  mild tender (improved greatly from yesterday), nondistended, Bs present Extremities: extremities normal, atraumatic, no cyanosis or edema Vaginal Bleeding: minimal  Assessment: s/p Procedure(s): DILATATION AND EVACUATION (N/A) LAPAROSCOPY DIAGNOSTIC LAPAROSCOPIC LYSIS OF ADHESIONS: stable 30 yo POD#5 s/p DSC LCS converted to mini lap for large almost 10 week ectopic pregnancy with R salpingectomy in the setting of overt trichomoniasis infection (treated with flagyl) now POD#0 s/p D&C for septic retained tissue in uterus and DSC LSC for acute and uncontrolled abdominal pain with overall negative LSC except adhesive disease. She clinically has PID with endometritis.   Plan: - Continue empiric IV abx with flagyl, gent, and amp for presumed endometritis and PID - still has leukocytosis - will follow CBC daily  - follow BCXs - hypoactive BS.; At high risk for ileus. Will increase diet slowly - encourage ambulation - ctm very closely. Clinically, her pain is improving greatly.   LOS: 0 days    FLAT[facility] CLD  Ranae Pila 12/27/2020, 3:15 AM

## 2020-12-27 NOTE — Progress Notes (Signed)
   12/27/20 1240  Clinical Encounter Type  Visited With Patient  Visit Type Initial;Social support;Psychological support;Post-op  Referral From Nurse  Consult/Referral To Chaplain  Spiritual Encounters  Spiritual Needs Emotional;Grief support   Chaplain responded to page for emotional support. Pt was alone in room. Chaplain engaged active listening and provided emotional and grief support. Pt has coped with her losses in many different ways and now feels ready to talk about her experiences in a support group setting. Chaplain will let [facility] Chaplain Marchelle Folks know as she has the information for these resources. Pt expressed appreciation for being able to vent and talk about things that have been building up. Chaplain shared that there's a chaplain available 24/7 if she needs to talk again. Chaplain remains available.   This note was prepared by Chaplain Resident, Tacy Learn, MDiv. Chaplain remains available as needed through the on-call pager: [phone].

## 2020-12-27 NOTE — Op Note (Signed)
PREOPERATIVE DIAGNOSES: 1. Endometritis 2. Postoperative abdominal pain  POSTOPERATIVE DIAGNOSES: Same  PROCEDURE PERFORMED: Dilation, suction, sharp curettage, diagnostic laparoscopy, lysis of adhesions  SURGEON: Dr. Belva Agee  ANESTHESIA: Paracervical block and  General  ESTIMATED BLOOD LOSS: 100cc.  COMPLICATIONS: None  TUBES: Foley  DRAINS: None  PATHOLOGY: Endometrial contents  FINDINGS: On exam, under anesthesia, normal appearing vulva and vagina, 9 week sized uterus  Operative findings <50cc of intraabdominal bloody fluid No overt bleeding NO evidence of bowel injury or injury to anywhere else Normal uterus. Normal right ovary. Hemostatic right adnexa. Normal Bowel. Normal omentum. Normal appendix Left adnexa with mild hydrosalpinx, adhesive disease, and left ovary only visualized after lysis of adhesions - no evidence of torsion.  Distended colon  Procedure: The patient was taken to the operating room where she was properly prepped and draped in sterile manner under general anesthesia.   Foley placed. After bimanual examination, the cervix was exposed with a speculum and the anterior lip of the cervix grasped with a tenaculum. Paracervical block performed. The endocervical canal was then progressively dilated to 87mm. Suction catheter was introduced into the uterus and to the uterine fundus. The uterus was evacuated and good tissue return was noted. A sharp curettage was then performed until gritty texture noted.This was all done under transvaginal guidance. Uterus noted to have significantly increased blood flow c/w infection. The cervix was grasped and a jacobs tenaculum was placed within the uterine cavity for manipulation purposes being careful not to puncture the uterus. Attention was then turned to the abdomen.   AN infraumbilical incision was made. Roseanne Reno entry was used to entry in the typical fashion and following this, a pneumoperitoneum of 15 mmHg was created. A  Hassan trocar was then passed through the same incision and the laparoscope was then inserted through the trocar sleeve.  Visualization of the peritoneal cavity was then obtained and a brief inspection did not reveal any signs of complications from entry. Under direct observation, 56mm suprapubic port was placed taking care to respect anatomical landmarks and vessels. Once the placement of the port was complete, the actual laparoscopic procedure began. Above operative findings noted.   The left adnexa was carefully and meticulously inspected. Some lysis of adhesiosn was performed with ligasure in order to properly visualize the left ovary.  All gas removed from abdomen and all instruments were removed from all places in the body. Fascia was closed with 0' vicryl and skin closed with 4'0 vicryl. The sponge and lap counts were correct times 2 at this time.   The patient's procedure was terminated. We then awakened her. She was sent to the Recovery Room in good condition.    Rosie Fate MD

## 2020-12-27 NOTE — Anesthesia Preprocedure Evaluation (Signed)
Anesthesia Evaluation  Patient identified by MRN, date of birth, ID band Patient awake    Reviewed: Allergy & Precautions, NPO status , Patient's Chart, lab work & pertinent test results  Airway Mallampati: II  TM Distance: >3 FB Neck ROM: Full    Dental  (+) Teeth Intact, Dental Advisory Given   Pulmonary Current Smoker and Patient abstained from smoking.,    breath sounds clear to auscultation       Cardiovascular negative cardio ROS   Rhythm:Regular Rate:Normal     Neuro/Psych Seizures -, Well Controlled,  negative psych ROS   GI/Hepatic negative GI ROS, Neg liver ROS,   Endo/Other    Renal/GU negative Renal ROS     Musculoskeletal   Abdominal Normal abdominal exam  (+)   Peds  Hematology   Anesthesia Other Findings   Reproductive/Obstetrics                             Anesthesia Physical Anesthesia Plan  ASA: 2 and emergent  Anesthesia Plan: General   Post-op Pain Management:    Induction: Intravenous, Rapid sequence and Cricoid pressure planned  PONV Risk Score and Plan: 3 and Ondansetron, Dexamethasone and Midazolam  Airway Management Planned: Oral ETT  Additional Equipment: None  Intra-op Plan:   Post-operative Plan: Extubation in OR  Informed Consent: I have reviewed the patients History and Physical, chart, labs and discussed the procedure including the risks, benefits and alternatives for the proposed anesthesia with the patient or authorized representative who has indicated his/her understanding and acceptance.     Dental advisory given  Plan Discussed with: CRNA  Anesthesia Plan Comments: (Lab Results      Component                Value               Date                      WBC                      13.5 (H)            12/27/2020                HGB                      11.6 (L)            12/27/2020                HCT                      32.8 (L)             12/27/2020                MCV                      86.8                12/27/2020                PLT                      268                 12/27/2020           )  Anesthesia Quick Evaluation  

## 2020-12-27 NOTE — Transfer of Care (Signed)
Immediate Anesthesia Transfer of Care Note  Patient: [name]  Procedure(s) Performed: DILATATION AND EVACUATION LAPAROSCOPY DIAGNOSTIC LAPAROSCOPIC LYSIS OF ADHESIONS  Patient Location: PACU  Anesthesia Type:General  Level of Consciousness: drowsy and responds to stimulation  Airway & Oxygen Therapy: Patient Spontanous Breathing and Patient connected to nasal cannula oxygen  Post-op Assessment: Report given to RN and Post -op Vital signs reviewed and stable  Post vital signs: Reviewed and stable  Last Vitals:  Vitals Value Taken Time  BP 103/53 12/27/20 0321  Temp    Pulse 98 12/27/20 0325  Resp 21 12/27/20 0325  SpO2 98 % 12/27/20 0325  Vitals shown include unvalidated device data.  Last Pain:  Vitals:   12/26/20 2321  TempSrc: Oral  PainSc:          Complications: No notable events documented.

## 2020-12-27 NOTE — Plan of Care (Signed)
Patient here post d/c.

## 2020-12-28 ENCOUNTER — Encounter (HOSPITAL_COMMUNITY): Payer: Self-pay | Admitting: Obstetrics and Gynecology

## 2020-12-28 LAB — CBC WITH DIFFERENTIAL/PLATELET
Abs Immature Granulocytes: 0.07 10*3/uL (ref 0.00–0.07)
Basophils Absolute: 0 10*3/uL (ref 0.0–0.1)
Basophils Relative: 0 %
Eosinophils Absolute: 0 10*3/uL (ref 0.0–0.5)
Eosinophils Relative: 0 %
HCT: 27.7 % — ABNORMAL LOW (ref 36.0–46.0)
Hemoglobin: 9.9 g/dL — ABNORMAL LOW (ref 12.0–15.0)
Immature Granulocytes: 1 %
Lymphocytes Relative: 18 %
Lymphs Abs: 1.9 10*3/uL (ref 0.7–4.0)
MCH: 31.2 pg (ref 26.0–34.0)
MCHC: 35.7 g/dL (ref 30.0–36.0)
MCV: 87.4 fL (ref 80.0–100.0)
Monocytes Absolute: 1.1 10*3/uL — ABNORMAL HIGH (ref 0.1–1.0)
Monocytes Relative: 10 %
Neutro Abs: 7.5 10*3/uL (ref 1.7–7.7)
Neutrophils Relative %: 71 %
Platelets: 253 10*3/uL (ref 150–400)
RBC: 3.17 MIL/uL — ABNORMAL LOW (ref 3.87–5.11)
RDW: 12.5 % (ref 11.5–15.5)
WBC: 10.6 10*3/uL — ABNORMAL HIGH (ref 4.0–10.5)
nRBC: 0 % (ref 0.0–0.2)

## 2020-12-28 MED ORDER — GLYCERIN (LAXATIVE) 2 G RE SUPP
1.0000 | Freq: Once | RECTAL | Status: DC
  Filled 2020-12-28: qty 1

## 2020-12-28 NOTE — Anesthesia Postprocedure Evaluation (Signed)
Anesthesia Post Note  Patient: [name]  Procedure(s) Performed: DILATATION AND EVACUATION LAPAROSCOPY DIAGNOSTIC LAPAROSCOPIC LYSIS OF ADHESIONS     Patient location during evaluation: PACU Anesthesia Type: General Level of consciousness: awake and alert Pain management: pain level controlled Vital Signs Assessment: post-procedure vital signs reviewed and stable Respiratory status: spontaneous breathing, nonlabored ventilation, respiratory function stable and patient connected to nasal cannula oxygen Cardiovascular status: blood pressure returned to baseline and stable Postop Assessment: no apparent nausea or vomiting Anesthetic complications: no   No notable events documented.        Shelton Silvas

## 2020-12-28 NOTE — Progress Notes (Signed)
1 Day Post-Op Procedure(s) (LRB): DILATATION AND EVACUATION (N/A) LAPAROSCOPY DIAGNOSTIC LAPAROSCOPIC LYSIS OF ADHESIONS  Subjective: Patient reports tolerating PO, + flatus, and no problems voiding.  Feels great. Denies pain unless moves quickly in certain directions (like when she bent over in the shower). Denies fever or chills.Passing copious flatus.   Objective: I have reviewed patient's vital signs, intake and output, medications, labs, and microbiology. She is smiling, conversive, and in NAD General: alert and cooperative Resp: clear to auscultation bilaterally Cardio: regular rate and rhythm, S1, S2 normal, no murmur, click, rub or gallop GI: soft, non-tender; bowel sounds normal; no masses,  no organomegaly Extremities: extremities normal, atraumatic, no cyanosis or edema Vaginal Bleeding: minimal  Assessment: s/p Procedure(s): DILATATION AND EVACUATION (N/A) LAPAROSCOPY DIAGNOSTIC LAPAROSCOPIC LYSIS OF ADHESIONS: stable 30 yo POD#6 s/p DSC LCS converted to mini lap for large almost 10 week ectopic pregnancy with R salpingectomy in the setting of overt trichomoniasis infection (treated with flagyl) now POD#1 s/p D&C for septic retained tissue in uterus and DSC LSC for acute and uncontrolled abdominal pain with overall negative LSC except adhesive disease. She clinically has PID with endometritis.   Plan: - Continue empiric IV abx with flagyl, gent, and amp for presumed endometritis and PID - leukocytosis downtrending and almost completely back to normal- will follow CBC daily  - follow BCXs - still not back yet - bs now normal and passing copious flatus - anticipate d/c tomorrow with PO abx   LOS: 1 day    FLATUS CLD  [name] 12/28/2020, 7:54 AM

## 2020-12-28 NOTE — Progress Notes (Signed)
Chaplain received epic consult from medical team and notification to follow up from weekend on-call chaplain [place]. Chaplain provided grief support counseling and education as well as resources for the [place] perinatal loss support group.  Please page as further needs arise.  Maryanna Shape. Carley Hammed, M.Div. [facility] Chaplain Pager [phone] Office [phone]      12/28/20 1400  Clinical Encounter Type  Visited With Patient and family together  Visit Type Follow-up;Psychological support  Referral From Chaplain;Nurse  Spiritual Encounters  Spiritual Needs Brochure;Grief support;Emotional

## 2020-12-29 LAB — CBC WITH DIFFERENTIAL/PLATELET
Abs Immature Granulocytes: 0.03 10*3/uL (ref 0.00–0.07)
Basophils Absolute: 0 10*3/uL (ref 0.0–0.1)
Basophils Relative: 0 %
Eosinophils Absolute: 0.1 10*3/uL (ref 0.0–0.5)
Eosinophils Relative: 1 %
HCT: 30.4 % — ABNORMAL LOW (ref 36.0–46.0)
Hemoglobin: 10.9 g/dL — ABNORMAL LOW (ref 12.0–15.0)
Immature Granulocytes: 0 %
Lymphocytes Relative: 32 %
Lymphs Abs: 2.2 10*3/uL (ref 0.7–4.0)
MCH: 30.5 pg (ref 26.0–34.0)
MCHC: 35.9 g/dL (ref 30.0–36.0)
MCV: 85.2 fL (ref 80.0–100.0)
Monocytes Absolute: 0.7 10*3/uL (ref 0.1–1.0)
Monocytes Relative: 10 %
Neutro Abs: 3.9 10*3/uL (ref 1.7–7.7)
Neutrophils Relative %: 57 %
Platelets: 303 10*3/uL (ref 150–400)
RBC: 3.57 MIL/uL — ABNORMAL LOW (ref 3.87–5.11)
RDW: 12.6 % (ref 11.5–15.5)
WBC: 6.9 10*3/uL (ref 4.0–10.5)
nRBC: 0 % (ref 0.0–0.2)

## 2020-12-29 LAB — SURGICAL PATHOLOGY

## 2020-12-29 MED ORDER — AMOXICILLIN-POT CLAVULANATE 875-125 MG PO TABS: 1.0000 | ORAL_TABLET | Freq: Two times a day (BID) | ORAL | 0 refills | Status: AC

## 2020-12-29 MED ORDER — METRONIDAZOLE 500 MG PO TABS: 500.0000 mg | ORAL_TABLET | Freq: Two times a day (BID) | ORAL | 0 refills | Status: AC

## 2020-12-29 NOTE — Discharge Summary (Signed)
Physician Discharge Summary  Patient ID: [name] MRN: [id number] DOB/AGE: [birth year] 30 y.o.  Admit date: 12/26/2020 Discharge date: 12/29/2020  Admission Diagnoses:  Discharge Diagnoses:  Active Problems:   Endometritis   Discharged Condition: good  Hospital Course: 30 yo presented POD#4 s/p DSC LCS converted to mini lap for large almost 10 week ectopic pregnancy with R salpingectomy in the setting of overt trichomoniasis infection (treated with combo if preop IV flagyl and PO postop). She was seen in an ED [place] and it took over 6 hours to be transferred to our hospital. At that point she had been treated with multiple doses of IV dilaudid. Upon arrival, she was distended, had several foul smelling blood clots evacuated from her cervix, leukocytosis, and was in pain. She had not passed flatus in one day. Imaging was NEGATIVE for any acute process including NO bowel injury seen, NO ileus or SBO seen, no bleeding, no abscess, and no issues. The only thing noteable was retained clot in her cervix causing marked cervical dilation. She was taken to the OR for urgent D&C for septic retained tissue in uterus and [facility] LSC for acute and uncontrolled abdominal pain with overall negative LSC except adhesive disease. She clinically had PID with endometritis. She was treated with > 48 hours of IV gent/amp/flagyl. She  never was febrile and have negative BCX. But she did have a leukocytosis.  WBC count downtrended to normal after 2 days of ABX. Her flatus returned in copious amount on POD#0. On day of discharge she 30 was POD#2 from her newest surgery and with pain controlled, passed flatus, minimal bleeding, and feeling very well. She was meeting all goals. She plans on colace BID and miralax daily in addition to PO flagyl and Augmentin x 14 days.   Consults: None  Significant Diagnostic Studies: labs: CBC    Component Value Date/Time   WBC 6.9 12/29/2020 0411   RBC 3.57 (L) 12/29/2020 0411    HGB 10.9 (L) 12/29/2020 0411   HCT 30.4 (L) 12/29/2020 0411   PLT 303 12/29/2020 0411   MCV 85.2 12/29/2020 0411   MCH 30.5 12/29/2020 0411   MCHC 35.9 12/29/2020 0411   RDW 12.6 12/29/2020 0411   LYMPHSABS 2.2 12/29/2020 0411   MONOABS 0.7 12/29/2020 0411   EOSABS 0.1 12/29/2020 0411   BASOSABS 0.0 12/29/2020 0411   , microbiology: blood culture: negative, and radiology:   EXAM: CT ABDOMEN AND PELVIS WITH CONTRAST  TECHNIQUE: Multidetector CT imaging of the abdomen and pelvis was performed using the standard protocol following bolus administration of intravenous contrast.  CONTRAST: 80 mL Omnipaque 350  COMPARISON: CT abdomen/pelvis 11/25/2016  FINDINGS: Lower chest: No acute abnormality.  Hepatobiliary: No focal liver abnormality is seen. No gallstones, gallbladder wall thickening, or biliary dilatation.  Pancreas: Unremarkable. No pancreatic ductal dilatation or surrounding inflammatory changes.  Spleen: Normal in size without focal abnormality.  Adrenals/Urinary Tract: Adrenal glands are unremarkable. Kidneys are normal, without renal calculi, focal lesion, or hydronephrosis. Bladder is unremarkable.  Stomach/Bowel: Stomach is within normal limits. Appendix appears normal. No evidence of bowel wall thickening, distention, or inflammatory changes.  Vascular/Lymphatic: No significant vascular findings are present. No enlarged abdominal or pelvic lymph nodes.  Reproductive: Ill-defined soft tissue in the region of the adnexa. The endometrial canal is markedly dilated and filled with heterogeneous material, particularly in the lower uterine segment extending into the dilated cervix. Low-attenuation cyst affiliated with the right adnexa, presumably a follicle. No large free fluid.  Other:  No abdominal wall hernia or abnormality. No abdominopelvic ascites.  Musculoskeletal: S shaped scoliosis. No acute fracture.  IMPRESSION: 1. Ill-defined soft tissue density  and stranding in the low anatomic pelvis is likely postsurgical in nature given history of recent right salpingectomy. No evidence of free fluid or abscess at this time. 2. Markedly distended endometrial canal, particularly in the lower uterine segment and extending into the dilated cervix both of which are filled with heterogeneous echogenic material. If the patient is actively menstruating, this may simply represent passage of menstrual flow.  Electronically Signed By: Malachy Moan M.D. On: 12/26/2020 15:47  US PELVIS COMPLETE TRANSABD TRANSVAG W D  Narrative  CLINICAL DATA: Left lower quadrant abdomen pain.  EXAM: TRANSABDOMINAL AND TRANSVAGINAL ULTRASOUND OF PELVIS  DOPPLER ULTRASOUND OF OVARIES  TECHNIQUE: Both transabdominal and transvaginal ultrasound examinations of the pelvis were performed. Transabdominal technique was performed for global imaging of the pelvis including uterus, ovaries, adnexal regions, and pelvic cul-de-sac.  It was necessary to proceed with endovaginal exam following the transabdominal exam to visualize the ovaries. Color and duplex Doppler ultrasound was utilized to evaluate blood flow to the ovaries.  COMPARISON: Ob ultrasound dated 12/22/2020  FINDINGS: Uterus  Measurements: 9.8 x 5.3 x 4.8 cm = volume: 130 mL.Marland Kitchen No fibroids or other mass visualized.  Endometrium  Thickness: 8.3 mm. Tiny amount of fluid in the endometrial cavity.  Right ovary  Measurements: 3.7 x 2.8 x 2.5 cm = volume: 13.5 mL. 2.4 cm follicle.  Left ovary  Measurements: 3.4 x 2.2 x 2.2 cm = volume: 8.8 mL. Normal appearance/no adnexal mass.  Pulsed Doppler evaluation of both ovaries demonstrates normal low-resistance arterial and venous waveforms.  Other findings  No abnormal free fluid.  IMPRESSION: Normal examination.  Electronically Signed By: Beckie Salts M.D. On: 12/26/2020 13:56  After [place] report seen and CT reviewed, the [place] report was  amended to reflect what the CT scan showed which is that there is a large heterogeneous area in the upper cervix extending down to the internal os. This measures 2.1 cm and is the same echogenicity of the known thinned EMS. This is most c/w a large blood clot   Treatments: IV hydration, IV abx, and surgery: D&C, DSC LSC, lsysis of adhestions  Discharge Exam: Blood pressure 123/73, pulse 69, temperature 98 F (36.7 C), temperature source Oral, resp. rate 18, SpO2 98 %. General appearance: alert and cooperative Resp: clear to auscultation bilaterally Cardio: regular rate and rhythm GI: soft, non-tender; bowel sounds normal; no masses,  no organomegaly Extremities: extremities normal, atraumatic, no cyanosis or edema Incision/Wound:c/d/i  Disposition: Discharge disposition: 01-Home or Self Care       Discharge Instructions     Call MD for:   Complete by: As directed    Heavy vaginal bleeding or abnormal vaginal discharge   Call MD for:  difficulty breathing, headache or visual disturbances   Complete by: As directed    Call MD for:  persistant nausea and vomiting   Complete by: As directed    Call MD for:  redness, tenderness, or signs of infection (pain, swelling, redness, odor or green/yellow discharge around incision site)   Complete by: As directed    Call MD for:  severe uncontrolled pain   Complete by: As directed    Call MD for:  temperature >100.4   Complete by: As directed    Diet general   Complete by: As directed    Driving Restrictions   Complete by: As directed  Do not drive until you are off narcotic pain medications and you feel like you can react in an emergency.   Increase activity slowly   Complete by: As directed    Leave dressing on - Keep it clean, dry, and intact until clinic visit   Complete by: As directed    Lifting restrictions   Complete by: As directed    Don't lift anything more than 15-20 pounds   Sexual Activity Restrictions   Complete  by: As directed    Nothing in the vagina x 2 to 6 weeks. We will discuss at clinic visit.      Allergies as of 12/29/2020       Reactions   Peanut-containing Drug Products Anaphylaxis        Medication List     TAKE these medications    albuterol 108 (90 Base) MCG/ACT inhaler Commonly known as: VENTOLIN HFA Inhale 1-2 puffs into the lungs every 6 (six) hours as needed for wheezing or shortness of breath.   amoxicillin-clavulanate 875-125 MG tablet Commonly known as: Augmentin Take 1 tablet by mouth 2 (two) times daily for 14 days.   docusate sodium 100 MG capsule Commonly known as: Colace Take 1 capsule (100 mg total) by mouth 2 (two) times daily.   fluticasone 50 MCG/ACT nasal spray Commonly known as: FLONASE Place 2 sprays into both nostrils daily for 14 days.   ibuprofen 800 MG tablet Commonly known as: ADVIL Take 1 tablet (800 mg total) by mouth every 8 (eight) hours as needed.   metroNIDAZOLE 500 MG tablet Commonly known as: Flagyl Take 1 tablet (500 mg total) by mouth 2 (two) times daily for 14 days. Take two tablets at one time with food. What changed:  how much to take how to take this when to take this   oxyCODONE 5 MG immediate release tablet Commonly known as: Oxy IR/ROXICODONE Take 1 tablet (5 mg total) by mouth every 6 (six) hours as needed for severe pain.               Discharge Care Instructions  (From admission, onward)           Start     Ordered   12/29/20 0000  Leave dressing on - Keep it clean, dry, and intact until clinic visit        12/29/20 0818             Signed: Ranae Pila 12/29/2020, 8:18 AM

## 2020-12-29 NOTE — Progress Notes (Signed)
Discharge instructions and prescriptions given to pt. Discussed post-op care, signs and symptoms to report to the MD, upcoming appointments, and meds. Pt verbalizes understanding and has no questions or concerns at this time. Pt discharged home from hospital in stable condition.

## 2021-01-01 LAB — CULTURE, BLOOD (ROUTINE X 2)
Culture: NO GROWTH
Culture: NO GROWTH
Special Requests: ADEQUATE
Special Requests: ADEQUATE

## 2021-01-04 ENCOUNTER — Telehealth: Payer: Self-pay | Admitting: Neurology

## 2021-01-04 ENCOUNTER — Encounter: Payer: Self-pay | Admitting: Neurology

## 2021-01-04 ENCOUNTER — Other Ambulatory Visit: Payer: Self-pay | Admitting: Neurology

## 2021-01-04 ENCOUNTER — Ambulatory Visit: Payer: 59 | Admitting: Neurology

## 2021-01-04 VITALS — BP 127/88 | HR 88 | Ht 63.0 in | Wt 109.0 lb

## 2021-01-04 DIAGNOSIS — Z87898 Personal history of other specified conditions: Secondary | ICD-10-CM | POA: Diagnosis not present

## 2021-01-04 DIAGNOSIS — G40909 Epilepsy, unspecified, not intractable, without status epilepticus: Secondary | ICD-10-CM

## 2021-01-04 DIAGNOSIS — R5600 Simple febrile convulsions: Secondary | ICD-10-CM
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
## 2021-01-04 NOTE — Patient Instructions (Signed)
I had a long discussion with the patient and her boyfriend regarding her history of febrile seizures and answered questions.  I recommend she do the MRI scan and EEG which was ordered but she has not done yet.  Since she has been seizure-free for more than 3 and half years we will hold off on definite anticoagulants for the moment unless she has a breakthrough.  She was advised to avoid seizure provoking stimuli like sleep deprivation, irregular eating and sleeping habits and stimulants like alcohol and street drugs.  Advised her to see her primary care physician to discuss ear pain and possibly seek referral to ENT specialist for recurrent infections.  She will return for follow-up in the future only as needed and no schedule appointment was made.

## 2021-01-04 NOTE — Telephone Encounter (Signed)
Patient was told to have a MRI done, I will need a new order put in. The one that is in there is over a year old.

## 2021-01-05 NOTE — Telephone Encounter (Signed)
Pt. Is scheduled for 01/12/21 at [facility].

## 2021-01-12 ENCOUNTER — Other Ambulatory Visit: Payer: 59

## 2022-08-22 LAB — OB RESULTS CONSOLE RPR: RPR: NONREACTIVE

## 2022-08-22 LAB — OB RESULTS CONSOLE HIV ANTIBODY (ROUTINE TESTING): HIV: NONREACTIVE

## 2022-08-22 LAB — OB RESULTS CONSOLE ABO/RH: RH Type: POSITIVE

## 2022-08-22 LAB — OB RESULTS CONSOLE RUBELLA ANTIBODY, IGM: Rubella: IMMUNE

## 2022-08-22 LAB — HEPATITIS C ANTIBODY: HCV Ab: NEGATIVE

## 2022-08-22 LAB — OB RESULTS CONSOLE HEPATITIS B SURFACE ANTIGEN: Hepatitis B Surface Ag: NEGATIVE

## 2022-09-19 IMAGING — DX DG CHEST 2V
2 series · 2 of 2 positions shown · non-contrast
Comparison: 06/28/2018

CLINICAL DATA: Productive cough

EXAM:
CHEST - 2 VIEW

[chest pa]
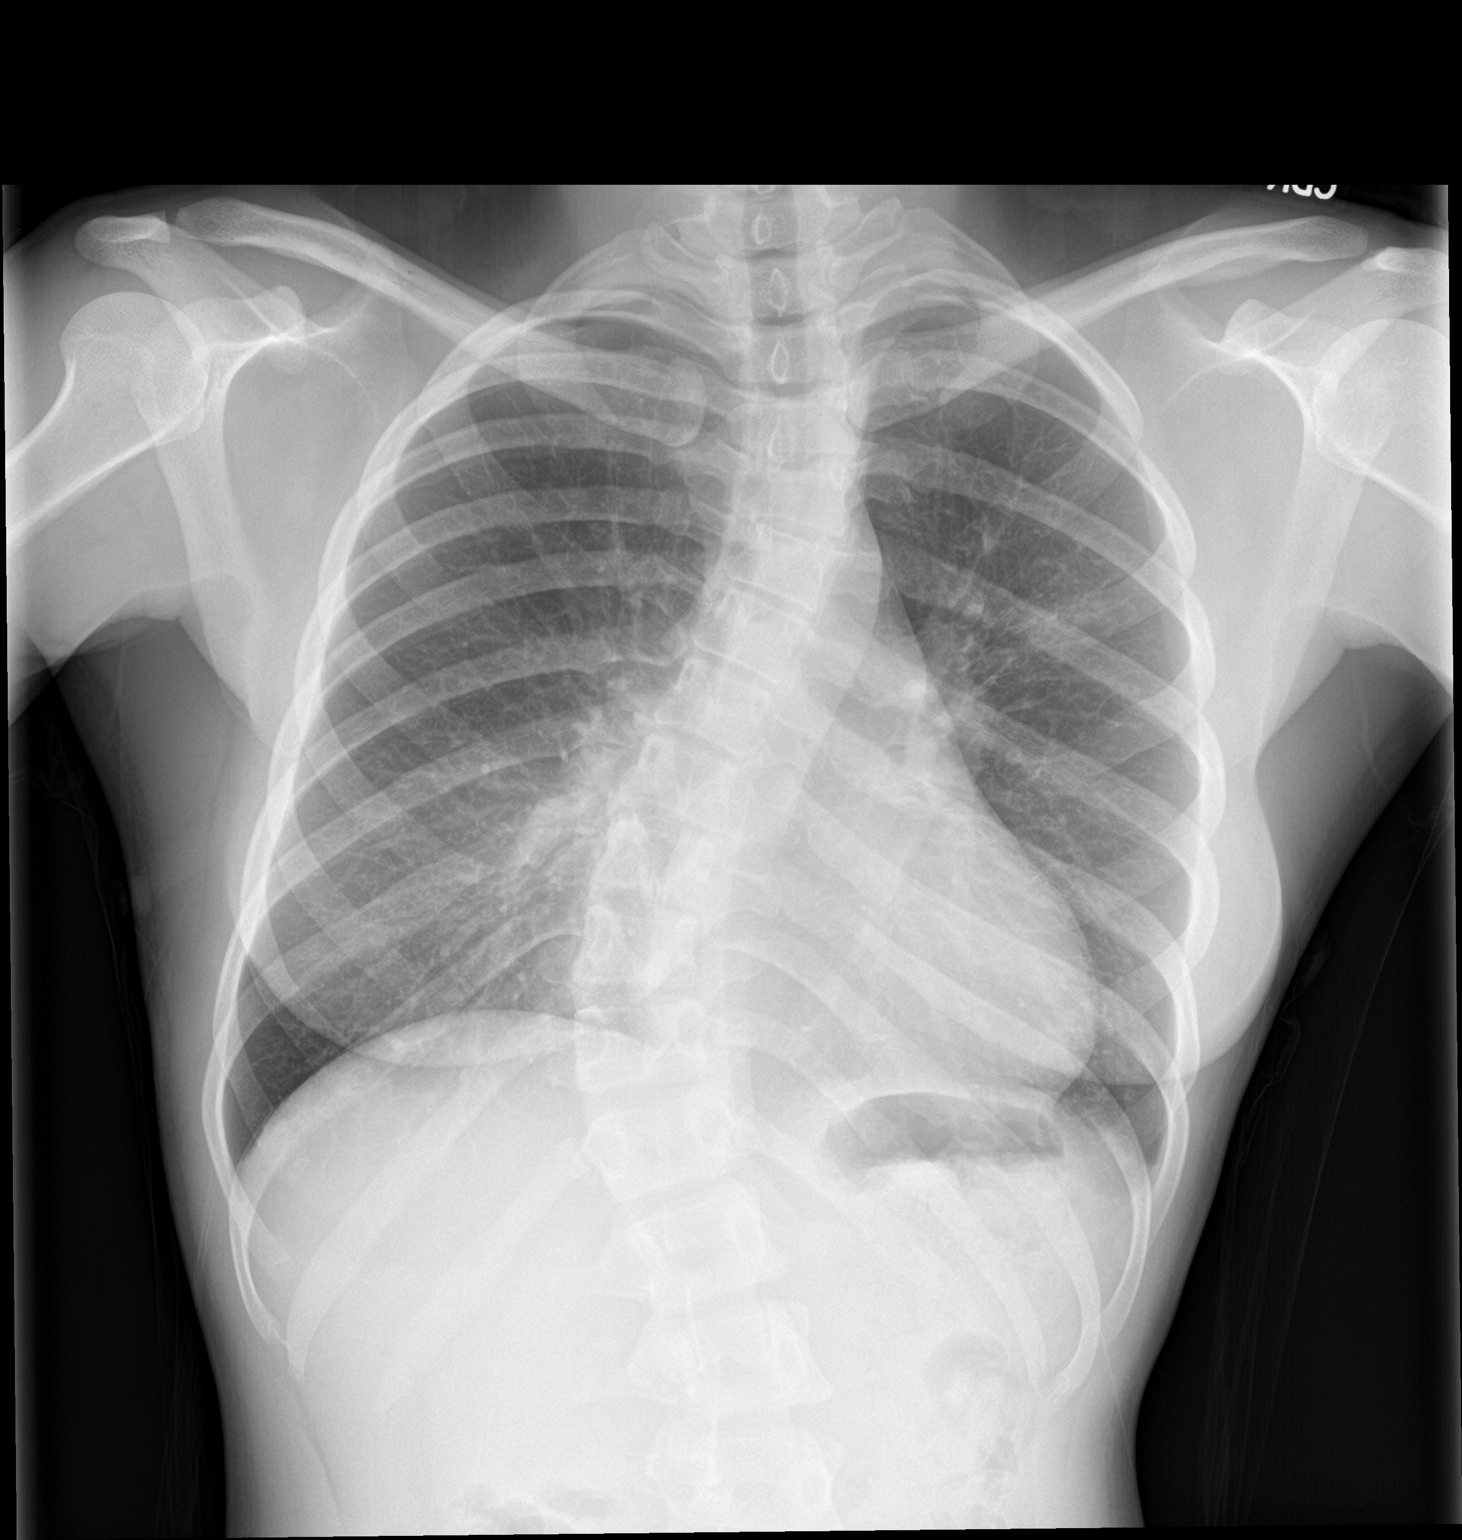

[chest lat]
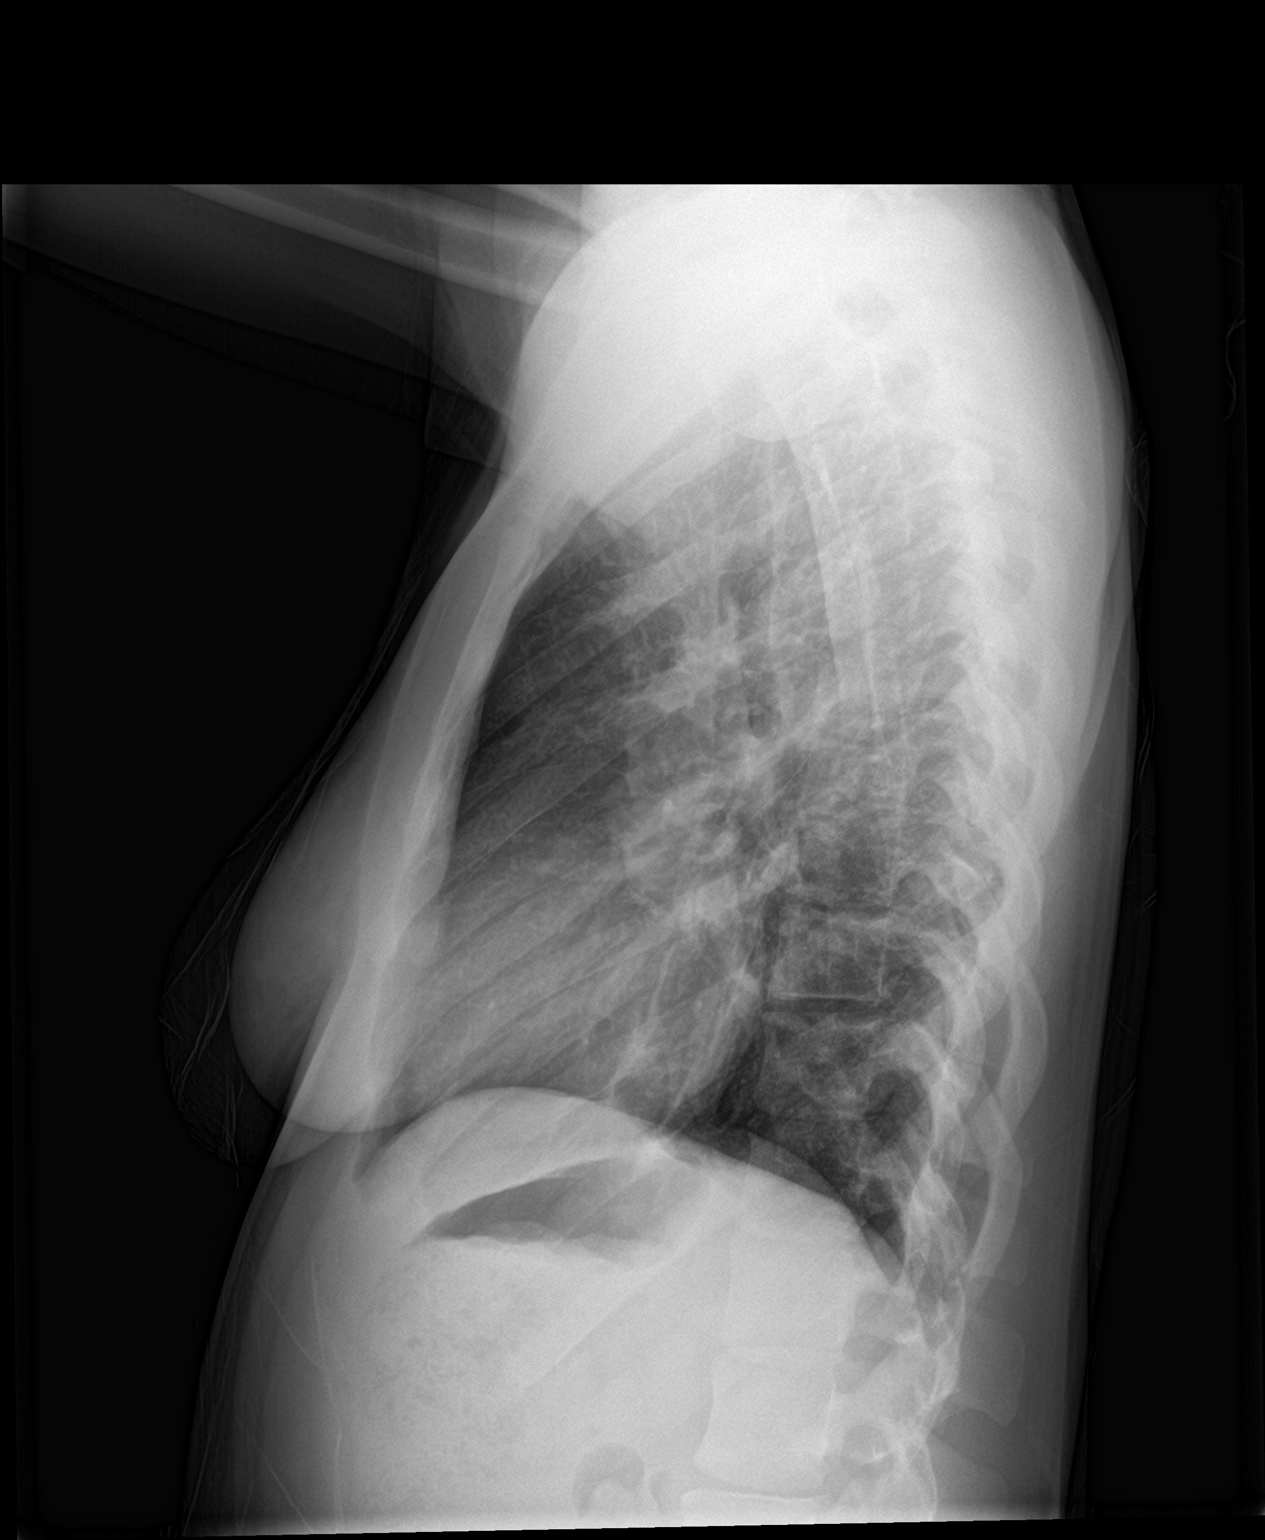

[2 of 2 positions shown; findings below may reference images not displayed]

FINDINGS: Heart and mediastinal contours are within normal limits. No focal
opacities or effusions. No acute bony abnormality. Thoracolumbar
scoliosis, stable.
IMPRESSION: No active cardiopulmonary disease.

## 2022-10-03 DIAGNOSIS — M62838 Other muscle spasm: Secondary | ICD-10-CM | POA: Diagnosis not present

## 2022-10-03 DIAGNOSIS — O99891 Other specified diseases and conditions complicating pregnancy: Secondary | ICD-10-CM | POA: Diagnosis not present

## 2022-10-03 DIAGNOSIS — Z3A10 10 weeks gestation of pregnancy: Secondary | ICD-10-CM | POA: Diagnosis not present

## 2022-10-04 DIAGNOSIS — O99891 Other specified diseases and conditions complicating pregnancy: Secondary | ICD-10-CM | POA: Diagnosis not present

## 2022-10-04 DIAGNOSIS — R252 Cramp and spasm: Secondary | ICD-10-CM | POA: Diagnosis not present

## 2022-10-04 DIAGNOSIS — Z3A10 10 weeks gestation of pregnancy: Secondary | ICD-10-CM | POA: Diagnosis not present

## 2022-10-06 LAB — OB RESULTS CONSOLE GC/CHLAMYDIA
Chlamydia: POSITIVE
Neisseria Gonorrhea: NEGATIVE

## 2022-11-08 DIAGNOSIS — Z361 Encounter for antenatal screening for raised alphafetoprotein level: Secondary | ICD-10-CM | POA: Diagnosis not present

## 2022-11-08 DIAGNOSIS — Z3A15 15 weeks gestation of pregnancy: Secondary | ICD-10-CM | POA: Diagnosis not present

## 2022-11-08 DIAGNOSIS — Z3482 Encounter for supervision of other normal pregnancy, second trimester: Secondary | ICD-10-CM | POA: Diagnosis not present

## 2022-11-16 DIAGNOSIS — Z361 Encounter for antenatal screening for raised alphafetoprotein level: Secondary | ICD-10-CM | POA: Diagnosis not present

## 2022-11-16 DIAGNOSIS — Z3A16 16 weeks gestation of pregnancy: Secondary | ICD-10-CM | POA: Diagnosis not present

## 2022-11-16 DIAGNOSIS — Z363 Encounter for antenatal screening for malformations: Secondary | ICD-10-CM | POA: Diagnosis not present

## 2022-11-16 LAB — OB RESULTS CONSOLE GC/CHLAMYDIA
Chlamydia: NEGATIVE
Neisseria Gonorrhea: NEGATIVE

## 2022-12-08 DIAGNOSIS — Z363 Encounter for antenatal screening for malformations: Secondary | ICD-10-CM | POA: Diagnosis not present

## 2022-12-08 DIAGNOSIS — Z3A19 19 weeks gestation of pregnancy: Secondary | ICD-10-CM | POA: Diagnosis not present

## 2023-01-09 DIAGNOSIS — O26642 Intrahepatic cholestasis of pregnancy, second trimester: Secondary | ICD-10-CM | POA: Diagnosis not present

## 2023-01-26 DIAGNOSIS — Z362 Encounter for other antenatal screening follow-up: Secondary | ICD-10-CM | POA: Diagnosis not present

## 2023-01-26 DIAGNOSIS — Z3480 Encounter for supervision of other normal pregnancy, unspecified trimester: Secondary | ICD-10-CM | POA: Diagnosis not present

## 2023-01-26 DIAGNOSIS — Z3482 Encounter for supervision of other normal pregnancy, second trimester: Secondary | ICD-10-CM | POA: Diagnosis not present

## 2023-01-26 DIAGNOSIS — Z3A26 26 weeks gestation of pregnancy: Secondary | ICD-10-CM | POA: Diagnosis not present

## 2023-01-30 DIAGNOSIS — O99810 Abnormal glucose complicating pregnancy: Secondary | ICD-10-CM | POA: Diagnosis not present

## 2023-03-08 DIAGNOSIS — Z8489 Family history of other specified conditions: Secondary | ICD-10-CM | POA: Diagnosis not present

## 2023-03-08 DIAGNOSIS — Z3A32 32 weeks gestation of pregnancy: Secondary | ICD-10-CM | POA: Diagnosis not present

## 2023-03-08 DIAGNOSIS — Z3483 Encounter for supervision of other normal pregnancy, third trimester: Secondary | ICD-10-CM | POA: Diagnosis not present

## 2023-03-09 DIAGNOSIS — O4703 False labor before 37 completed weeks of gestation, third trimester: Secondary | ICD-10-CM | POA: Diagnosis not present

## 2023-03-09 DIAGNOSIS — Z3A31 31 weeks gestation of pregnancy: Secondary | ICD-10-CM | POA: Diagnosis not present

## 2023-03-14 DIAGNOSIS — Z3A33 33 weeks gestation of pregnancy: Secondary | ICD-10-CM | POA: Diagnosis not present

## 2023-03-14 DIAGNOSIS — O0903 Supervision of pregnancy with history of infertility, third trimester: Secondary | ICD-10-CM | POA: Diagnosis not present

## 2023-03-14 DIAGNOSIS — Z3A34 34 weeks gestation of pregnancy: Secondary | ICD-10-CM | POA: Diagnosis not present

## 2023-03-27 DIAGNOSIS — Z3A35 35 weeks gestation of pregnancy: Secondary | ICD-10-CM | POA: Diagnosis not present

## 2023-03-27 DIAGNOSIS — O26613 Liver and biliary tract disorders in pregnancy, third trimester: Secondary | ICD-10-CM | POA: Diagnosis not present

## 2023-04-04 DIAGNOSIS — Z3685 Encounter for antenatal screening for Streptococcus B: Secondary | ICD-10-CM | POA: Diagnosis not present

## 2023-04-04 LAB — OB RESULTS CONSOLE GBS: GBS: NEGATIVE

## 2023-04-11 DIAGNOSIS — O139 Gestational [pregnancy-induced] hypertension without significant proteinuria, unspecified trimester: Secondary | ICD-10-CM | POA: Diagnosis not present

## 2023-04-11 DIAGNOSIS — Z3A37 37 weeks gestation of pregnancy: Secondary | ICD-10-CM | POA: Diagnosis not present

## 2023-04-11 DIAGNOSIS — O26613 Liver and biliary tract disorders in pregnancy, third trimester: Secondary | ICD-10-CM | POA: Diagnosis not present

## 2023-04-14 ENCOUNTER — Telehealth (HOSPITAL_COMMUNITY): Payer: Self-pay | Admitting: *Deleted

## 2023-04-14 ENCOUNTER — Encounter (HOSPITAL_COMMUNITY): Payer: Self-pay | Admitting: *Deleted

## 2023-04-14 NOTE — Telephone Encounter (Signed)
Preadmission screen  

## 2023-04-16 ENCOUNTER — Inpatient Hospital Stay (HOSPITAL_COMMUNITY): Payer: Medicaid Other

## 2023-04-16 ENCOUNTER — Encounter (HOSPITAL_COMMUNITY): Payer: Self-pay | Admitting: *Deleted

## 2023-04-16 ENCOUNTER — Other Ambulatory Visit: Payer: Self-pay

## 2023-04-16 ENCOUNTER — Inpatient Hospital Stay (HOSPITAL_COMMUNITY)
Admission: AD | Admit: 2023-04-16 | Discharge: 2023-04-19 | DRG: 788 | Disposition: A | Discharge status: Home / Self Care | Payer: Medicaid Other | Attending: Obstetrics & Gynecology | Admitting: Obstetrics & Gynecology

## 2023-04-16 DIAGNOSIS — K7689 Other specified diseases of liver: Secondary | ICD-10-CM | POA: Diagnosis present

## 2023-04-16 DIAGNOSIS — O26643 Intrahepatic cholestasis of pregnancy, third trimester: Principal | ICD-10-CM

## 2023-04-16 DIAGNOSIS — Z98891 History of uterine scar from previous surgery: Principal | ICD-10-CM

## 2023-04-16 DIAGNOSIS — Z833 Family history of diabetes mellitus: Secondary | ICD-10-CM | POA: Diagnosis not present

## 2023-04-16 DIAGNOSIS — Z87891 Personal history of nicotine dependence: Secondary | ICD-10-CM

## 2023-04-16 DIAGNOSIS — O34211 Maternal care for low transverse scar from previous cesarean delivery: Secondary | ICD-10-CM | POA: Diagnosis not present

## 2023-04-16 DIAGNOSIS — Z8249 Family history of ischemic heart disease and other diseases of the circulatory system: Secondary | ICD-10-CM | POA: Diagnosis not present

## 2023-04-16 DIAGNOSIS — E7879 Other disorders of bile acid and cholesterol metabolism: Secondary | ICD-10-CM | POA: Diagnosis not present

## 2023-04-16 DIAGNOSIS — Z3A37 37 weeks gestation of pregnancy: Secondary | ICD-10-CM | POA: Diagnosis not present

## 2023-04-16 DIAGNOSIS — O26649 Intrahepatic cholestasis of pregnancy, unspecified trimester: Principal | ICD-10-CM | POA: Diagnosis present

## 2023-04-16 DIAGNOSIS — O2662 Liver and biliary tract disorders in childbirth: Secondary | ICD-10-CM | POA: Diagnosis not present

## 2023-04-16 LAB — COMPREHENSIVE METABOLIC PANEL
ALT: 29 U/L (ref 0–44)
AST: 25 U/L (ref 15–41)
Albumin: 2.8 g/dL — ABNORMAL LOW (ref 3.5–5.0)
Alkaline Phosphatase: 255 U/L — ABNORMAL HIGH (ref 38–126)
Anion gap: 8 (ref 5–15)
BUN: <5 mg/dL — ABNORMAL LOW (ref 6–20)
CO2: 25 mmol/L (ref 22–32)
Calcium: 9.1 mg/dL (ref 8.9–10.3)
Chloride: 103 mmol/L (ref 98–111)
Creatinine, Ser: 0.86 mg/dL (ref 0.44–1.00)
GFR, Estimated: >60 mL/min (ref >60)
Glucose, Bld: 81 mg/dL (ref 70–99)
Potassium: 4.3 mmol/L (ref 3.5–5.1)
Sodium: 136 mmol/L (ref 135–145)
Total Bilirubin: 1 mg/dL (ref <1.2)
Total Protein: 7.1 g/dL (ref 6.5–8.1)

## 2023-04-16 LAB — CBC
HCT: 40.2 % (ref 36.0–46.0)
Hemoglobin: 13.8 g/dL (ref 12.0–15.0)
MCH: 30.1 pg (ref 26.0–34.0)
MCHC: 34.3 g/dL (ref 30.0–36.0)
MCV: 87.6 fL (ref 80.0–100.0)
Platelets: 286 10*3/uL (ref 150–400)
RBC: 4.59 MIL/uL (ref 3.87–5.11)
RDW: 14.9 % (ref 11.5–15.5)
WBC: 5.6 10*3/uL (ref 4.0–10.5)
nRBC: 0 % (ref 0.0–0.2)

## 2023-04-16 LAB — TYPE AND SCREEN
ABO/RH(D): A POS
Antibody Screen: NEGATIVE

## 2023-04-16 MED ORDER — LIDOCAINE HCL (PF) 1 % IJ SOLN: 30.0000 mL | INTRAMUSCULAR | Status: DC | PRN

## 2023-04-16 MED ORDER — OXYCODONE-ACETAMINOPHEN 5-325 MG PO TABS: 1.0000 | ORAL_TABLET | ORAL | Status: DC | PRN

## 2023-04-16 MED ORDER — LACTATED RINGERS IV SOLN
500.0000 mL | INTRAVENOUS | Status: DC | PRN
  Administered 2023-04-16 – 2023-04-17 (×3): 500 mL via INTRAVENOUS

## 2023-04-16 MED ORDER — OXYTOCIN-SODIUM CHLORIDE 30-0.9 UT/500ML-% IV SOLN: 2.5000 [IU]/h | INTRAVENOUS | Status: DC

## 2023-04-16 MED ORDER — FENTANYL CITRATE (PF) 100 MCG/2ML IJ SOLN
50.0000 ug | INTRAMUSCULAR | Status: DC | PRN
  Administered 2023-04-16: 50 ug via INTRAVENOUS
  Administered 2023-04-17 (×4): 100 ug via INTRAVENOUS
  Filled 2023-04-16 (×5): qty 2

## 2023-04-16 MED ORDER — SOD CITRATE-CITRIC ACID 500-334 MG/5ML PO SOLN
30.0000 mL | ORAL | Status: DC | PRN
Start: 2023-04-16 — End: 2023-04-17

## 2023-04-16 MED ORDER — ZOLPIDEM TARTRATE 5 MG PO TABS: 5.0000 mg | ORAL_TABLET | Freq: Every evening | ORAL | Status: DC | PRN

## 2023-04-16 MED ORDER — ACETAMINOPHEN 325 MG PO TABS
650.0000 mg | ORAL_TABLET | ORAL | Status: DC | PRN
Start: 2023-04-16 — End: 2023-04-17

## 2023-04-16 MED ORDER — ONDANSETRON HCL 4 MG/2ML IJ SOLN
4.0000 mg | Freq: Four times a day (QID) | INTRAMUSCULAR | Status: DC | PRN
Start: 2023-04-16 — End: 2023-04-17

## 2023-04-16 MED ORDER — LACTATED RINGERS IV SOLN: INTRAVENOUS | Status: DC

## 2023-04-16 MED ORDER — OXYTOCIN-SODIUM CHLORIDE 30-0.9 UT/500ML-% IV SOLN
1.0000 m[IU]/min | INTRAVENOUS | Status: DC
  Administered 2023-04-17: 2 m[IU]/min via INTRAVENOUS
  Filled 2023-04-16: qty 500

## 2023-04-16 MED ORDER — OXYTOCIN BOLUS FROM INFUSION: 333.0000 mL | Freq: Once | INTRAVENOUS | Status: DC

## 2023-04-16 MED ORDER — OXYCODONE-ACETAMINOPHEN 5-325 MG PO TABS: 2.0000 | ORAL_TABLET | ORAL | Status: DC | PRN

## 2023-04-16 MED ORDER — TERBUTALINE SULFATE 1 MG/ML IJ SOLN: 0.2500 mg | Freq: Once | INTRAMUSCULAR | Status: DC | PRN

## 2023-04-16 MED ORDER — MISOPROSTOL 25 MCG QUARTER TABLET
25.0000 ug | ORAL_TABLET | ORAL | Status: DC | PRN
  Administered 2023-04-16: 25 ug via VAGINAL
  Filled 2023-04-16: qty 1

## 2023-04-16 NOTE — H&P (Signed)
[name] is a 32 y.o. female G4P1020 at [redacted]w[redacted]d presenting for IOL secondary to ICP.  Patient reports generalized pruritus x weeks and BA returned elevated on 12/24 at 14.1.  Patient also has h/o neonatal term demise but not delivered at [facility] and no records available.  Patient is a poor historian and reports getting PNC entire pregnancy but delivery after 40 weeks. Reports no fhts a few weeks before delivery but they sent her home with nothing to do. Then subsequently gave birth to a live infant around 41-42 weeks that was "underdeveloped" and only lived x 12 hours. U/S showed bilateral UTDA1 with last u/s 01/26/23 and RRP measured 5.5 mm and LRP 5.7 mm.  Patient also reports seizure d/o - no seizure in years. No meds.  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  Patient has SST - FOB was not tested.  H/O chlamydia this pregnancy with negative TOC on 11/16/22.  GBS negative.  OB History     Gravida  3   Para      Term      Preterm      AB  2   Living         SAB  1   IAB      Ectopic  1   Multiple      Live Births             Past Medical History:  Diagnosis Date   Cholestasis during pregnancy    Epiglottitis 06/29/2018   Scoliosis    Seizures (HCC)    Past Surgical History:  Procedure Laterality Date   DIAGNOSTIC LAPAROSCOPY WITH REMOVAL OF ECTOPIC PREGNANCY N/A 12/22/2020   Procedure: ATTEMPTED DIAGNOSTIC LAPAROSCOPY;  Surgeon: Ranae Pila, MD;  Location: [facility] OR;  Service: Gynecology;  Laterality: N/A;   DILATION AND EVACUATION N/A 12/27/2020   Procedure: DILATATION AND EVACUATION;  Surgeon: Ranae Pila, MD;  Location: [facility] OR;  Service: Gynecology;  Laterality: N/A;   LAPAROSCOPIC LYSIS OF ADHESIONS  12/27/2020   Procedure: LAPAROSCOPIC LYSIS OF ADHESIONS;  Surgeon: Ranae Pila, MD;  Location: [facility] OR;  Service: Gynecology;;   LAPAROSCOPY  12/27/2020   Procedure: LAPAROSCOPY DIAGNOSTIC;  Surgeon: Ranae Pila, MD;  Location: [facility] OR;  Service: Gynecology;;   LAPAROTOMY N/A 12/22/2020   Procedure: EXPLORATORY LAPAROTOMY WITH REMOVAL OF ECTOPIC PREGNANCY;  Surgeon: Ranae Pila, MD;  Location: [facility] OR;  Service: Gynecology;  Laterality: N/A;   NO PAST SURGERIES     UNILATERAL SALPINGECTOMY Right 12/22/2020   Procedure: UNILATERAL SALPINGECTOMY;  Surgeon: Ranae Pila, MD;  Location: [facility] OR;  Service: Gynecology;  Laterality: Right;   Family History: family history includes Diabetes in her maternal grandfather; Hypertension in her maternal grandmother; Seizures in her paternal uncle. Social History:  reports that she has quit smoking. Her smoking use included cigars and cigarettes. She has a 9 pack-year smoking history. She has never used smokeless tobacco. She reports that she does not currently use alcohol. She reports that she does not currently use drugs after having used the following drugs: Marijuana.     Maternal Diabetes: No Genetic Screening: Normal Maternal Ultrasounds/Referrals: Fetal renal pyelectasis Fetal Ultrasounds or other Referrals:  None Maternal Substance Abuse:  No Significant Maternal Medications:  Meds include: Other: hydroxyzine, ursodiol Significant Maternal Lab Results:  Group B Strep negative Number of Prenatal Visits:greater than 3 verified prenatal visits Maternal Vaccinations:TDap and Flu Other Comments:  None  Review of Systems  Maternal Medical History:  Fetal activity: Perceived fetal activity is normal.   Last perceived fetal movement was within the past hour.   Prenatal Complications - Diabetes: none.     Blood pressure (!) 145/91, pulse 83, temperature 98.2 F (36.8 C), temperature source Oral, resp. rate 18, height 5\' 3"  (1.6 m), weight 73.1 kg, SpO2 100%. Maternal Exam:  Uterine Assessment: Contraction strength is mild.  Contraction frequency is irregular.  Abdomen: Patient reports no abdominal tenderness. Fundal height is c/w dates.    Estimated fetal weight is 7#.     Fetal Exam Fetal Monitor Review: Baseline rate: 140.  Variability: moderate (6-25 bpm).   Pattern: accelerations present and no decelerations.   Fetal State Assessment: Category I - tracings are normal.   Physical Exam Constitutional:      Appearance: Normal appearance.  HENT:     Head: Normocephalic and atraumatic.  Pulmonary:     Effort: Pulmonary effort is normal.  Abdominal:     Palpations: Abdomen is soft.  Musculoskeletal:        General: Normal range of motion.     Cervical back: Normal range of motion.  Skin:    General: Skin is warm and dry.  Neurological:     Mental Status: She is alert and oriented to person, place, and time.  Psychiatric:        Mood and Affect: Mood normal.        Behavior: Behavior normal.     Prenatal labs: ABO, Rh: --/--/PENDING (12/29 1730) Antibody: PENDING (12/29 1730) Rubella: Immune (05/06 0000) RPR: Nonreactive (05/06 0000)  HBsAg: Negative (05/06 0000)  HIV: Non-reactive (05/06 0000)  GBS: Negative/-- (12/17 0000)   Assessment/Plan: 16XW [id number] at [redacted]w[redacted]d for IOL for ICP -Plan misoprostol IOL -CLEA if desired -Anticipate NSVD   Mitchel Honour 04/16/2023, 5:57 PM

## 2023-04-17 ENCOUNTER — Other Ambulatory Visit: Payer: Self-pay

## 2023-04-17 ENCOUNTER — Inpatient Hospital Stay (HOSPITAL_COMMUNITY): Payer: Medicaid Other | Admitting: Anesthesiology

## 2023-04-17 ENCOUNTER — Encounter (HOSPITAL_COMMUNITY): Payer: Self-pay | Admitting: Obstetrics & Gynecology

## 2023-04-17 ENCOUNTER — Encounter (HOSPITAL_COMMUNITY)
Admission: AD | Disposition: A | Discharge status: Home / Self Care | Payer: Self-pay | Source: Home / Self Care | Attending: Obstetrics & Gynecology

## 2023-04-17 DIAGNOSIS — Z3A37 37 weeks gestation of pregnancy: Secondary | ICD-10-CM

## 2023-04-17 DIAGNOSIS — O34211 Maternal care for low transverse scar from previous cesarean delivery: Secondary | ICD-10-CM | POA: Diagnosis not present

## 2023-04-17 DIAGNOSIS — O2662 Liver and biliary tract disorders in childbirth: Secondary | ICD-10-CM | POA: Diagnosis not present

## 2023-04-17 LAB — RPR: RPR Ser Ql: NONREACTIVE

## 2023-04-17 SURGERY — Surgical Case
Anesthesia: Epidural

## 2023-04-17 MED ORDER — FENTANYL CITRATE (PF) 100 MCG/2ML IJ SOLN
INTRAMUSCULAR | Status: AC
  Filled 2023-04-17: qty 2

## 2023-04-17 MED ORDER — DROPERIDOL 2.5 MG/ML IJ SOLN: 0.6250 mg | Freq: Once | INTRAMUSCULAR | Status: DC | PRN

## 2023-04-17 MED ORDER — SIMETHICONE 80 MG PO CHEW
80.0000 mg | CHEWABLE_TABLET | ORAL | Status: DC | PRN
Start: 2023-04-17 — End: 2023-04-19

## 2023-04-17 MED ORDER — ACETAMINOPHEN 500 MG PO TABS: 1000.0000 mg | ORAL_TABLET | Freq: Four times a day (QID) | ORAL | Status: DC

## 2023-04-17 MED ORDER — STERILE WATER FOR IRRIGATION IR SOLN
Status: DC | PRN
  Administered 2023-04-17: 1000 mL

## 2023-04-17 MED ORDER — OXYTOCIN-SODIUM CHLORIDE 30-0.9 UT/500ML-% IV SOLN
INTRAVENOUS | Status: DC | PRN
  Administered 2023-04-17: 300 mL via INTRAVENOUS

## 2023-04-17 MED ORDER — SODIUM CHLORIDE 0.9 % IV SOLN
INTRAVENOUS | Status: DC | PRN
  Administered 2023-04-17: 500 mg via INTRAVENOUS

## 2023-04-17 MED ORDER — MORPHINE SULFATE (PF) 0.5 MG/ML IJ SOLN
INTRAMUSCULAR | Status: AC
  Filled 2023-04-17: qty 10

## 2023-04-17 MED ORDER — OXYCODONE HCL 5 MG PO TABS: 5.0000 mg | ORAL_TABLET | ORAL | Status: DC | PRN

## 2023-04-17 MED ORDER — DIPHENHYDRAMINE HCL 25 MG PO CAPS: 25.0000 mg | ORAL_CAPSULE | Freq: Four times a day (QID) | ORAL | Status: DC | PRN

## 2023-04-17 MED ORDER — ACETAMINOPHEN 500 MG PO TABS
1000.0000 mg | ORAL_TABLET | Freq: Four times a day (QID) | ORAL | Status: DC
  Administered 2023-04-17 – 2023-04-19 (×8): 1000 mg via ORAL
  Filled 2023-04-17 (×8): qty 2

## 2023-04-17 MED ORDER — PHENYLEPHRINE HCL-NACL 20-0.9 MG/250ML-% IV SOLN
INTRAVENOUS | Status: AC
Start: 2023-04-17 — End: ?
  Filled 2023-04-17: qty 250

## 2023-04-17 MED ORDER — ZOLPIDEM TARTRATE 5 MG PO TABS: 5.0000 mg | ORAL_TABLET | Freq: Every evening | ORAL | Status: DC | PRN

## 2023-04-17 MED ORDER — COCONUT OIL OIL: 1.0000 | TOPICAL_OIL | Status: DC | PRN

## 2023-04-17 MED ORDER — EPHEDRINE 5 MG/ML INJ: 10.0000 mg | INTRAVENOUS | Status: DC | PRN

## 2023-04-17 MED ORDER — SODIUM CHLORIDE 0.9 % IV SOLN
INTRAVENOUS | Status: AC
  Filled 2023-04-17: qty 5

## 2023-04-17 MED ORDER — MORPHINE SULFATE (PF) 0.5 MG/ML IJ SOLN
INTRAMUSCULAR | Status: DC | PRN
  Administered 2023-04-17: 3 mg via EPIDURAL

## 2023-04-17 MED ORDER — WITCH HAZEL-GLYCERIN EX PADS: 1.0000 | MEDICATED_PAD | CUTANEOUS | Status: DC | PRN

## 2023-04-17 MED ORDER — DEXMEDETOMIDINE HCL IN NACL 80 MCG/20ML IV SOLN
INTRAVENOUS | Status: AC
  Filled 2023-04-17: qty 20

## 2023-04-17 MED ORDER — SENNOSIDES-DOCUSATE SODIUM 8.6-50 MG PO TABS
2.0000 | ORAL_TABLET | Freq: Every day | ORAL | Status: DC
  Administered 2023-04-18 – 2023-04-19 (×2): 2 via ORAL
  Filled 2023-04-17 (×2): qty 2

## 2023-04-17 MED ORDER — SIMETHICONE 80 MG PO CHEW
80.0000 mg | CHEWABLE_TABLET | Freq: Three times a day (TID) | ORAL | Status: DC
  Administered 2023-04-17 – 2023-04-18 (×3): 80 mg via ORAL
  Filled 2023-04-17 (×3): qty 1

## 2023-04-17 MED ORDER — MENTHOL 3 MG MT LOZG: 1.0000 | LOZENGE | OROMUCOSAL | Status: DC | PRN

## 2023-04-17 MED ORDER — ONDANSETRON HCL 4 MG/2ML IJ SOLN: 4.0000 mg | Freq: Three times a day (TID) | INTRAMUSCULAR | Status: DC | PRN

## 2023-04-17 MED ORDER — ONDANSETRON HCL 4 MG/2ML IJ SOLN
INTRAMUSCULAR | Status: DC | PRN
  Administered 2023-04-17: 4 mg via INTRAVENOUS

## 2023-04-17 MED ORDER — LACTATED RINGERS IV SOLN: 500.0000 mL | Freq: Once | INTRAVENOUS | Status: AC

## 2023-04-17 MED ORDER — CHLOROPROCAINE HCL (PF) 3 % IJ SOLN
INTRAMUSCULAR | Status: AC
Start: 2023-04-17 — End: ?
  Filled 2023-04-17: qty 20

## 2023-04-17 MED ORDER — SODIUM CHLORIDE 0.9% FLUSH: 3.0000 mL | INTRAVENOUS | Status: DC | PRN

## 2023-04-17 MED ORDER — DIPHENHYDRAMINE HCL 25 MG PO CAPS: 25.0000 mg | ORAL_CAPSULE | ORAL | Status: DC | PRN

## 2023-04-17 MED ORDER — CEFAZOLIN SODIUM-DEXTROSE 2-3 GM-%(50ML) IV SOLR
INTRAVENOUS | Status: DC | PRN
  Administered 2023-04-17: 2 g via INTRAVENOUS

## 2023-04-17 MED ORDER — CHLOROPROCAINE HCL (PF) 3 % IJ SOLN
INTRAMUSCULAR | Status: DC | PRN
  Administered 2023-04-17 (×2): 10 mL via EPIDURAL

## 2023-04-17 MED ORDER — NALOXONE HCL 4 MG/10ML IJ SOLN: 1.0000 ug/kg/h | INTRAVENOUS | Status: DC | PRN

## 2023-04-17 MED ORDER — DEXAMETHASONE SODIUM PHOSPHATE 10 MG/ML IJ SOLN
INTRAMUSCULAR | Status: DC | PRN
  Administered 2023-04-17: 10 mg via INTRAVENOUS

## 2023-04-17 MED ORDER — FENTANYL CITRATE (PF) 100 MCG/2ML IJ SOLN
INTRAMUSCULAR | Status: DC | PRN
  Administered 2023-04-17: 100 ug via EPIDURAL

## 2023-04-17 MED ORDER — DIPHENHYDRAMINE HCL 50 MG/ML IJ SOLN: 12.5000 mg | INTRAMUSCULAR | Status: DC | PRN

## 2023-04-17 MED ORDER — OXYTOCIN-SODIUM CHLORIDE 30-0.9 UT/500ML-% IV SOLN
2.5000 [IU]/h | INTRAVENOUS | Status: AC
  Administered 2023-04-17: 2.5 [IU]/h via INTRAVENOUS
  Filled 2023-04-17: qty 500

## 2023-04-17 MED ORDER — FENTANYL-BUPIVACAINE-NACL 0.5-0.125-0.9 MG/250ML-% EP SOLN
12.0000 mL/h | EPIDURAL | Status: DC | PRN
  Administered 2023-04-17: 12 mL/h via EPIDURAL
  Filled 2023-04-17: qty 250

## 2023-04-17 MED ORDER — ACETAMINOPHEN 10 MG/ML IV SOLN
INTRAVENOUS | Status: DC | PRN
  Administered 2023-04-17: 1000 mg via INTRAVENOUS

## 2023-04-17 MED ORDER — LIDOCAINE HCL (PF) 1 % IJ SOLN
INTRAMUSCULAR | Status: DC | PRN
  Administered 2023-04-17 (×2): 4 mL via EPIDURAL

## 2023-04-17 MED ORDER — FENTANYL CITRATE (PF) 100 MCG/2ML IJ SOLN: 25.0000 ug | INTRAMUSCULAR | Status: DC | PRN

## 2023-04-17 MED ORDER — PHENYLEPHRINE 80 MCG/ML (10ML) SYRINGE FOR IV PUSH (FOR BLOOD PRESSURE SUPPORT): 80.0000 ug | PREFILLED_SYRINGE | INTRAVENOUS | Status: DC | PRN

## 2023-04-17 MED ORDER — PRENATAL MULTIVITAMIN CH
1.0000 | ORAL_TABLET | Freq: Every day | ORAL | Status: DC
  Administered 2023-04-18 – 2023-04-19 (×2): 1 via ORAL
  Filled 2023-04-17 (×2): qty 1

## 2023-04-17 MED ORDER — SODIUM CHLORIDE 0.9 % IR SOLN
Status: DC | PRN
  Administered 2023-04-17: 1

## 2023-04-17 MED ORDER — KETOROLAC TROMETHAMINE 30 MG/ML IJ SOLN: 30.0000 mg | Freq: Four times a day (QID) | INTRAMUSCULAR | Status: AC | PRN

## 2023-04-17 MED ORDER — DIBUCAINE (PERIANAL) 1 % EX OINT: 1.0000 | TOPICAL_OINTMENT | CUTANEOUS | Status: DC | PRN

## 2023-04-17 MED ORDER — IBUPROFEN 600 MG PO TABS
600.0000 mg | ORAL_TABLET | Freq: Four times a day (QID) | ORAL | Status: DC
  Administered 2023-04-18 – 2023-04-19 (×4): 600 mg via ORAL
  Filled 2023-04-17 (×4): qty 1

## 2023-04-17 MED ORDER — DEXMEDETOMIDINE HCL IN NACL 80 MCG/20ML IV SOLN
INTRAVENOUS | Status: DC | PRN
  Administered 2023-04-17 (×2): 8 ug via INTRAVENOUS

## 2023-04-17 MED ORDER — KETOROLAC TROMETHAMINE 30 MG/ML IJ SOLN
30.0000 mg | Freq: Four times a day (QID) | INTRAMUSCULAR | Status: AC
  Administered 2023-04-17 – 2023-04-18 (×4): 30 mg via INTRAVENOUS
  Filled 2023-04-17 (×4): qty 1

## 2023-04-17 MED ORDER — NALOXONE HCL 0.4 MG/ML IJ SOLN: 0.4000 mg | INTRAMUSCULAR | Status: DC | PRN

## 2023-04-17 MED ORDER — ACETAMINOPHEN 10 MG/ML IV SOLN
INTRAVENOUS | Status: AC
  Filled 2023-04-17: qty 100

## 2023-04-17 MED ORDER — KETOROLAC TROMETHAMINE 30 MG/ML IJ SOLN
30.0000 mg | Freq: Once | INTRAMUSCULAR | Status: AC
  Administered 2023-04-17: 30 mg via INTRAVENOUS

## 2023-04-17 MED ORDER — KETOROLAC TROMETHAMINE 30 MG/ML IJ SOLN
INTRAMUSCULAR | Status: AC
  Filled 2023-04-17: qty 1

## 2023-04-17 MED ORDER — FENTANYL CITRATE (PF) 100 MCG/2ML IJ SOLN
25.0000 ug | INTRAMUSCULAR | Status: DC | PRN
Start: 2023-04-17 — End: 2023-04-17
  Administered 2023-04-17: 25 ug via INTRAVENOUS
  Administered 2023-04-17: 50 ug via INTRAVENOUS

## 2023-04-17 SURGICAL SUPPLY — 32 items
BENZOIN TINCTURE PRP APPL 2/3 (GAUZE/BANDAGES/DRESSINGS) ×1 IMPLANT
CHLORAPREP W/TINT 26 (MISCELLANEOUS) ×2 IMPLANT
CLAMP UMBILICAL CORD (MISCELLANEOUS) ×1 IMPLANT
CLOTH BEACON ORANGE TIMEOUT ST (SAFETY) ×1 IMPLANT
DERMABOND ADVANCED .7 DNX12 (GAUZE/BANDAGES/DRESSINGS) IMPLANT
DRSG OPSITE POSTOP 4X10 (GAUZE/BANDAGES/DRESSINGS) ×1 IMPLANT
ELECT REM PT RETURN 9FT ADLT (ELECTROSURGICAL) ×1
ELECTRODE REM PT RTRN 9FT ADLT (ELECTROSURGICAL) ×1 IMPLANT
EXTRACTOR VACUUM KIWI (MISCELLANEOUS) IMPLANT
GLOVE BIOGEL PI IND STRL 6 (GLOVE) ×1 IMPLANT
GLOVE SS PI 5.5 STRL (GLOVE) ×1 IMPLANT
GOWN STRL REUS W/TWL LRG LVL3 (GOWN DISPOSABLE) ×2 IMPLANT
KIT ABG SYR 3ML LUER SLIP (SYRINGE) ×1 IMPLANT
NDL HYPO 25X5/8 SAFETYGLIDE (NEEDLE) ×1 IMPLANT
NEEDLE HYPO 25X5/8 SAFETYGLIDE (NEEDLE) ×1 IMPLANT
NS IRRIG 1000ML POUR BTL (IV SOLUTION) ×1 IMPLANT
PACK C SECTION WH (CUSTOM PROCEDURE TRAY) ×1 IMPLANT
PAD OB MATERNITY 4.3X12.25 (PERSONAL CARE ITEMS) ×1 IMPLANT
RTRCTR C-SECT PINK 25CM LRG (MISCELLANEOUS) ×1 IMPLANT
STRIP CLOSURE SKIN 1/2X4 (GAUZE/BANDAGES/DRESSINGS) IMPLANT
SUT CHROMIC 2 0 CT 1 (SUTURE) ×1 IMPLANT
SUT MNCRL 0 VIOLET CTX 36 (SUTURE) IMPLANT
SUT MON AB 2-0 CT1 27 (SUTURE) ×1 IMPLANT
SUT MON AB-0 CT1 36 (SUTURE) ×1 IMPLANT
SUT PDS AB 0 CT1 27 (SUTURE) ×1 IMPLANT
SUT VIC AB 0 CTX36XBRD ANBCTRL (SUTURE) ×2 IMPLANT
SUT VIC AB 2-0 CT1 TAPERPNT 27 (SUTURE) IMPLANT
SUT VIC AB 4-0 KS 27 (SUTURE) IMPLANT
SUT VIC AB 4-0 PS2 27 (SUTURE) ×1 IMPLANT
TOWEL OR 17X24 6PK STRL BLUE (TOWEL DISPOSABLE) ×1 IMPLANT
TRAY FOLEY W/BAG SLVR 14FR LF (SET/KITS/TRAYS/PACK) IMPLANT
WATER STERILE IRR 1000ML POUR (IV SOLUTION) ×1 IMPLANT

## 2023-04-17 NOTE — Anesthesia Procedure Notes (Signed)
Epidural Patient location during procedure: OB Start time: 04/17/2023 8:38 AM End time: 04/17/2023 8:41 AM  Staffing Anesthesiologist: Kaylyn Layer, MD Performed: anesthesiologist   Preanesthetic Checklist Completed: patient identified, IV checked, risks and benefits discussed, monitors and equipment checked, pre-op evaluation and timeout performed  Epidural Patient position: sitting Prep: DuraPrep and site prepped and draped Patient monitoring: continuous pulse ox, blood pressure and heart rate Approach: midline Location: L3-L4 Injection technique: LOR air  Needle:  Needle type: Tuohy  Needle gauge: 17 G Needle length: 9 cm Needle insertion depth: 5 cm Catheter type: closed end flexible Catheter size: 19 Gauge Catheter at skin depth: 10 cm Test dose: negative and Other (1% lidocaine)  Assessment Events: blood not aspirated, injection not painful, no injection resistance, no paresthesia and negative IV test  Additional Notes Patient identified. Risks, benefits, and alternatives discussed with patient including but not limited to bleeding, infection, nerve damage, paralysis, failed block, incomplete pain control, headache, blood pressure changes, nausea, vomiting, reactions to medication, itching, and postpartum back pain. Confirmed with bedside nurse the patient's most recent platelet count. Confirmed with patient that they are not currently taking any anticoagulation, have any bleeding history, or any family history of bleeding disorders. Patient expressed understanding and wished to proceed. All questions were answered. Sterile technique was used throughout the entire procedure. Please see nursing notes for vital signs.   Crisp LOR on first pass. DPE technique with Whitacre 25g spinal needle due to significant lumbar levoscoliosis with clear CSF return.Test dose was given through epidural catheter and negative prior to continuing to dose epidural or start infusion. Warning  signs of high block given to the patient including shortness of breath, tingling/numbness in hands, complete motor block, or any concerning symptoms with instructions to call for help. Patient was given instructions on fall risk and not to get out of bed. All questions and concerns addressed with instructions to call with any issues or inadequate analgesia.  Reason for block:procedure for pain

## 2023-04-17 NOTE — Transfer of Care (Signed)
Immediate Anesthesia Transfer of Care Note  Patient: [name]  Procedure(s) Performed: CESAREAN SECTION  Patient Location: PACU  Anesthesia Type:Epidural  Level of Consciousness: awake, alert , and oriented  Airway & Oxygen Therapy: Patient Spontanous Breathing  Post-op Assessment: Report given to RN and Post -op Vital signs reviewed and stable  Post vital signs: Reviewed and stable  Last Vitals:  Vitals Value Taken Time  BP 107/64 04/17/23 1107  Temp    Pulse 123 04/17/23 1112  Resp 20 04/17/23 1112  SpO2 97 % 04/17/23 1112  Vitals shown include unfiled device data.  Last Pain:  Vitals:   04/17/23 0930  TempSrc: Axillary  PainSc:          Complications: No notable events documented.

## 2023-04-17 NOTE — Progress Notes (Addendum)
Labor Progress Note  Feeling better after epidural. FHT remains reassuring Cervix 3/90/-1 > AROMed with clear fluid. 4cm after rupture. Continue pitocin augmentation.  Of note, on chart review in Care Everywhere, patient was seen in 2017 at [facility] facility in [place] for eval of vaginal bleeding at 28wga. She had had a neg UPT a few months prior and had a beta hcg of <2.4 at the time. She was subsequently evaluated by psych in the ED - this is per their HPI 06/2016: "Janora is 32 years of age she states she moved here from [place] states she was born in [place] she came to live in [place] recently to be with the father of her child but that did not work out according to her and she moved into the [employer]. She is now residing there. She states she's depressed and she tells me she is depressed because she had the death of premature baby last month however in November she was found not to be pregnant but having delusions of pregnancy and further her story does not add up as far as the timing of having a delivery when we know her pregnancy test was negative in November. So once again she is delusional but she is not danger she denies auditory and visual hallucinations denies thoughts of harming self or others does not want to come in the hospital wanted a therapist."  Upon further discussion with patient and FOB, they continue to report delivery around 41-42wga of an "underdeveloped" but alive neonate that subsequently demised. She does not recall the year that this occurred. For social work consult postpartum and close postpartum mood f/u given hx of psychiatric illness.  Jule Economy, MD

## 2023-04-17 NOTE — Anesthesia Postprocedure Evaluation (Signed)
Anesthesia Post Note  Patient: [name]  Procedure(s) Performed: CESAREAN SECTION     Patient location during evaluation: PACU Anesthesia Type: Epidural Level of consciousness: awake and alert Pain management: pain level controlled Vital Signs Assessment: post-procedure vital signs reviewed and stable Respiratory status: spontaneous breathing, nonlabored ventilation and respiratory function stable Cardiovascular status: blood pressure returned to baseline Postop Assessment: epidural receding, no apparent nausea or vomiting, no headache and no backache Anesthetic complications: no   No notable events documented.  Last Vitals:  Vitals:   04/17/23 1147 04/17/23 1200  BP: 138/78 138/70  Pulse: 99 (!) 103  Resp: 20 (!) 26  Temp: 36.8 C   SpO2: 97% 100%    Last Pain:  Vitals:   04/17/23 1210  TempSrc:   PainSc: 0-No pain   Pain Goal:    LLE Motor Response: Purposeful movement (04/17/23 1200) LLE Sensation: Tingling (04/17/23 1200) RLE Motor Response: Purposeful movement (04/17/23 1200) RLE Sensation: Tingling (04/17/23 1200) L Sensory Level: T10-Umbilical region (04/17/23 1200) R Sensory Level: T10-Umbilical region (04/17/23 1200) Epidural/Spinal Function Cutaneous sensation: Tingles (04/17/23 1200), Patient able to flex knees: Yes (04/17/23 1200), Patient able to lift hips off bed: Yes (04/17/23 1200), Back pain beyond tenderness at insertion site: No (04/17/23 1200), Progressively worsening motor and/or sensory loss: No (04/17/23 1200), Bowel and/or bladder incontinence post epidural: No (04/17/23 1200)  Shanda Howells

## 2023-04-17 NOTE — Lactation Note (Signed)
This note was copied from a baby's chart. Lactation Consultation Note  Patient Name: [name] [id number] Date: 04/17/2023 Age:30 hours Reason for consult: Initial assessment;1st time breastfeeding;Early term 37-38.6wks MOB attempted to latch infant on her right breast using the football hold position, infant latched with 20 mm NS, but did not suckle at the breast. Infant been spitty and has small emesis while LC in the room. LC discussed hand expression and MOB expressed 2 mls of colostrum that was spoon fed to infant. LC set MOB up with DEBP, due MOB using 20 mm NS and infant not currently latching at the breast. MOB was pumping as LC left the room. MOB will continue to BF infant every 2 -3 hours, skin to skin. MOB knows to call for further latch assistance if needed. LC discussed infant's input and output, infant had 3 stools since birth, FOB changed a stool while LC was in the room. LC discussed importance of MOB rest, balance meals and snacks. MOB was made aware of O/P services, breastfeeding support groups, community resources, and our phone # for post-discharge questions.    Maternal Data Has patient been taught Hand Expression?: Yes Does the patient have breastfeeding experience prior to this delivery?: No  Feeding Mother's Current Feeding Choice: Breast Milk  LATCH Score Latch: Too sleepy or reluctant, no latch achieved, no sucking elicited.  Audible Swallowing: None  Type of Nipple: Everted at rest and after stimulation (short shafted, given NS by RN.)  Comfort (Breast/Nipple): Soft / non-tender  Hold (Positioning): Assistance needed to correctly position infant at breast and maintain latch.  LATCH Score: 5   Lactation Tools Discussed/Used Tools: Pump;Flanges Nipple shield size: 20 (given by RN) Flange Size: 18 Breast pump type: Double-Electric Breast Pump Pump Education: Milk Storage;Setup, frequency, and cleaning Reason for Pumping: MOB using 20 mm NS Pumping  frequency: MOB will continue to pump every 3 hours for 15 minutes to help stimulate and establish her milk .  Interventions    Discharge Pump: DEBP;Personal  Consult Status Consult Status: Follow-up Date: 04/18/23 Follow-up type: In-patient    [name] 04/17/2023, 6:09 PM

## 2023-04-17 NOTE — Progress Notes (Addendum)
Labor Progress Note - Delayed Entry  Called to bedside for persistent prolonged deceleration. Pitocin already off and patient in the midst of position changes. Cervix had just been checked by RN, found to be unchanged from prior. FHR persistently in the 80s-90s for 2 min at this time. Terb IM administered as I entered the room. Patient placed in hands and knees with brief recovery of heart rate to 100s, then dropped back to 80s despite all interventions. Checked her cervix while on hands and knees initially to place fetal scalp electrode, however HR able to be traced adequately using external monitor. Cervix remained 4/90/-1. Code cesarean called at this time. Verbally consented patient and partner.  Jule Economy, MD

## 2023-04-17 NOTE — Anesthesia Preprocedure Evaluation (Addendum)
Anesthesia Evaluation  Patient identified by MRN, date of birth, ID band Patient awake    Reviewed: Allergy & Precautions, NPO status , Patient's Chart, lab work & pertinent test results  History of Anesthesia Complications Negative for: history of anesthetic complications  Airway Mallampati: II  TM Distance: >3 FB Neck ROM: Full    Dental no notable dental hx.    Pulmonary Patient abstained from smoking., former smoker   Pulmonary exam normal        Cardiovascular negative cardio ROS Normal cardiovascular exam     Neuro/Psych negative neurological ROS  negative psych ROS   GI/Hepatic negative GI ROS, Neg liver ROS,,,  Endo/Other  negative endocrine ROS    Renal/GU negative Renal ROS  negative genitourinary   Musculoskeletal negative musculoskeletal ROS (+)    Abdominal   Peds  Hematology negative hematology ROS (+)   Anesthesia Other Findings Day of surgery medications reviewed with patient.  Reproductive/Obstetrics (+) Pregnancy                             Anesthesia Physical Anesthesia Plan  ASA: 2 and emergent  Anesthesia Plan: Epidural   Post-op Pain Management: Ofirmev IV (intra-op)*   Induction:   PONV Risk Score and Plan: 3 and Treatment may vary due to age or medical condition, Ondansetron and Dexamethasone  Airway Management Planned: Natural Airway  Additional Equipment: Fetal Monitoring  Intra-op Plan:   Post-operative Plan:   Informed Consent: I have reviewed the patients History and Physical, chart, labs and discussed the procedure including the risks, benefits and alternatives for the proposed anesthesia with the patient or authorized representative who has indicated his/her understanding and acceptance.       Plan Discussed with: CRNA  Anesthesia Plan Comments: (Labor epidural used for Code C/S (fetal bradycardia). Stephannie Peters, MD)       Anesthesia  Quick Evaluation

## 2023-04-17 NOTE — Op Note (Signed)
PROCEDURE DATE: 04/17/2023   PREOPERATIVE DIAGNOSIS: [redacted] weeks gestation of pregnancy, nonreassuring fetal heart tracing, bilateral fetal UTDA1, history of ectopic, history of chlamydia with neg TOC, ?hx of neonatal term demise   POSTOPERATIVE DIAGNOSIS: The same   PROCEDURE:  Primary Low Transverse Cesarean Section   SURGEON:  Dr. Jule Economy  ASSISTANT: Dr. Candelaria Celeste An experienced assistant was required given the standard of surgical care given the complexity of the case.  This assistant was needed for exposure, dissection, suctioning, retraction, instrument exchange, assisting with delivery with administration of fundal pressure, and for overall help during the procedure.   INDICATIONS: 32yo [id number] at [redacted]w[redacted]d who was initially admitted for induction of labor for ICP, developed recurrent prolonged decelerations and apparent terminal bradycardia. Code cesarean was called, FHR recovery to 166bpm once back to the operating room.  FINDINGS:  Viable female infant in vertex OP presentation, APGARs 8+9, Weight 2980g, Amniotic fluid clear, Intact placenta, three vessel cord. Minimal intraabdominal adhesive disease. Grossly normal uterus.  ANESTHESIA:    Epidural ESTIMATED BLOOD LOSS: 464ccs SPECIMENS: Placenta for routine COMPLICATIONS: None immediate   PROCEDURE IN DETAIL:  The patient received intravenous antibiotics (Ancef and azithro) and had sequential compression devices applied to her lower extremities while in the preoperative area.  She was then taken to the operating room where spinal anesthesia was confirmed to be adequate. She was then placed in a dorsal supine position with a leftward tilt, and prepped and draped in a sterile manner.  After an adequate timeout was performed, a Pfannenstiel skin incision was made with scalpel and carried through to the underlying layer of fascia. The fascia was incised in the midline and this incision was extended bilaterally bluntly. Kocher clamps were  applied to the superior aspect of the fascial incision and the underlying rectus muscles were dissected off bluntly and with the Bovie. A similar process was carried out on the inferior aspect of the facial incision. The rectus muscles were separated in the midline bluntly and the peritoneum was entered bluntly.  The peritoneum was extended bilaterally and an Alexis retractor was placed for better visualization. A transverse hysterotomy was made with a scalpel and extended bilaterally bluntly. The infant was successfully delivered, and cord was clamped and cut and infant was handed over to awaiting neonatology team. Cord blood and gas was collected. The placenta delivered intact with three-vessel cord. The uterus was cleared of clot and debris.  The hysterotomy was closed with 0-monocryl.  Excellent hemostasis was noted. At this time, the case was turned over to Dr. Adrian Blackwater as I was needed in another operating room. The Alexis retractor was removed. The peritoneum was closed with 2-0 vicryl. The fascia was closed with 0-Vicryl in a running fashion with good restoration of anatomy.  The subcutaneus tissue was irrigated and was reapproximated using 2-0 monocryl.  The skin was closed with 4-0 Vicryl in a subcuticular fashion and dermabond was applied.  All surgical sites examined and hemostatic at end of procedure.   Pt tolerated the procedure well. All sponge/lap/needle counts were correct  X 2. Pt taken to recovery room in stable condition.   Jule Economy, MD

## 2023-04-18 LAB — CBC
HCT: 28.1 % — ABNORMAL LOW (ref 36.0–46.0)
Hemoglobin: 10 g/dL — ABNORMAL LOW (ref 12.0–15.0)
MCH: 30.8 pg (ref 26.0–34.0)
MCHC: 35.6 g/dL (ref 30.0–36.0)
MCV: 86.5 fL (ref 80.0–100.0)
Platelets: 240 10*3/uL (ref 150–400)
RBC: 3.25 MIL/uL — ABNORMAL LOW (ref 3.87–5.11)
RDW: 14.8 % (ref 11.5–15.5)
WBC: 10.8 10*3/uL — ABNORMAL HIGH (ref 4.0–10.5)
nRBC: 0 % (ref 0.0–0.2)

## 2023-04-18 NOTE — Progress Notes (Signed)
 CSW received consult for hx of Depression; and fetal demise at 82 hrs old. CSW met with MOB to offer support and complete assessment. CSW entered the room, introduced herself and explained the reason for the visit. MOB presented performing skin to skin with the infant; and  was calm, agreeable to consult and remained engaged throughout encounter.  CSW inquired about MOB's mental health history. MOB reported currently struggling with breastfeeding due to not producing enough milk at the moment and not wanting to supplement with formula. CSW was sympathetic with MOB and gave reassurance to her worries. 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 CSW provided with therapy resources in the [place] incase of possible PP symptoms. CSW provided education regarding the baby blues period vs. perinatal mood disorders, discussed treatment and gave resources for mental health follow up if concerns arise.  CSW recommends self-evaluation during the postpartum time period using the New Mom Checklist from Postpartum Progress and encouraged MOB to contact a medical professional if symptoms are noted at any time.    CSW asked MOB has she selected a pediatrician for the infant's follow up visits; [facility] said [facility].  MOB reported having all essential items for the infant including a carseat, bassinet and crib for safe sleeping. CSW provided review of Sudden Infant Death Syndrome (SIDS) precautions.    CSW identifies no further need for intervention and no barriers to discharge at this time.  Rosina Molt, [place] Clinical Social Worker [phone]

## 2023-04-18 NOTE — Progress Notes (Signed)
 Subjective: Postpartum Day 1: Cesarean Delivery s/s nrFHT in the setting of cholestasis of pregnancy.  Patient reports tolerating PO and no problems voiding.    Objective: Vital signs in last 24 hours: Temp:  [96.2 F (35.7 C)-98.8 F (37.1 C)] 98.8 F (37.1 C) (12/31 0506) Pulse Rate:  [68-131] 83 (12/31 0506) Resp:  [12-26] 16 (12/31 0506) BP: (107-148)/(64-89) 116/80 (12/31 0506) SpO2:  [95 %-100 %] 97 % (12/30 1530)  Physical Exam:  General: alert Lochia: appropriate Uterine Fundus: firm Incision: healing well, mild abdominal distension with normoactive bs DVT Evaluation: No evidence of DVT seen on physical exam.  Recent Labs    04/16/23 1730 04/18/23 0554  HGB 13.8 10.0*  HCT 40.2 28.1*    Assessment/Plan: Status post Cesarean section. Doing well postoperatively. Mild distention - no s/s active bleeding or ileus.  Baby not feeding well and not ready for circ.  Continue current care.   Kelly Delon Milian, MD 04/18/2023, 7:23 AM

## 2023-04-19 MED ORDER — IBUPROFEN 600 MG PO TABS: 600.0000 mg | ORAL_TABLET | Freq: Four times a day (QID) | ORAL | 0 refills | Status: AC

## 2023-04-19 MED ORDER — OXYCODONE HCL 5 MG PO TABS: 5.0000 mg | ORAL_TABLET | ORAL | 0 refills | Status: AC | PRN

## 2023-04-19 NOTE — Discharge Instructions (Signed)
 WHAT TO LOOK OUT FOR: Fever of 100.4 or above Mastitis: feels like flu and breasts hurt Infection: increased pain, swelling or redness Blood clots golf ball size or larger Postpartum depression   Congratulations on your newest addition!

## 2023-04-19 NOTE — Lactation Note (Addendum)
 This note was copied from a baby's chart. Lactation Consultation Note  Patient Name: [name] Date: 04/19/2023 Age:30 hours  Reason for consult: Follow-up assessment;Primapara;1st time breastfeeding;Difficult latch;Other (Comment)  P1, [redacted]w[redacted]d, 4% weight loss, C/S delivery  Mother states that she very motivated to breast feed. She feels he was latching at first but didn't feel he was getting anything. Parents wanted baby to get formula. Infant is formula feeding well. She attempted latch but mostly formula fed.  Mother was receptive to assistance with breastfeeding. (Baby just took 35 ml of formula) Baby sustained latch when teacup hold of breast tissue was used and breast tissue held in infant's mouth. Baby does dimple left cheek with sucking. Reviewed teaching of NS.  Instructed how to apply NS, observe for deep latch, listen for swallows and breast softening once milk is in. Mother should  observe for milk in the shield. Mother's nipple should appear rounded and not pinched. Pump for stimulation of milk production.    Discussed the process of milk production, supply and demand and the importance of breast stimulation and milk removal in order to make an optimal milk supply.  Discussed mother to breastfeed 8-12 times in 24 hours, skin to skin and breast feed before formula feeding.   If missed feedings at breast or substituting feeding with formula, advised to hand express and/or pump to remove milk from the breast.   Mother has requested additional OP Lactation Consultation to assist with breastfeeding. Referral made to [facility].   Feeding Mother's Current Feeding Choice: Breast Milk and Formula Nipple Type: Slow - flow  LATCH Score Latch: Repeated attempts needed to sustain latch, nipple held in mouth throughout feeding, stimulation needed to elicit sucking reflex.  Audible Swallowing: None  Type of Nipple: Everted at rest and after  stimulation  Comfort (Breast/Nipple): Soft / non-tender  Hold (Positioning): Assistance needed to correctly position infant at breast and maintain latch.  LATCH Score: 6   Lactation Tools Discussed/Used Tools: Shells;Pump Nipple shield size: 20 Breast pump type: Manual  Interventions Interventions: Breast feeding basics reviewed;Assisted with latch;Breast compression;Hand express;Adjust position;Support pillows;Education  Discharge Discharge Education: Engorgement and breast care;Warning signs for feeding baby;Outpatient recommendation;Outpatient Epic message sent Pump: DEBP;Personal  Consult Status Consult Status: Complete Date: 04/19/23    Joshua Rojelio HERO 04/19/2023, 12:49 PM

## 2023-04-19 NOTE — Discharge Summary (Signed)
 Postpartum Discharge Summary  Date of Service updated1/1/25     Patient Name: [name] DOB: Jun 03, 1990 MRN: [id number]  Date of admission: 04/16/2023 Delivery date:04/17/2023 Delivering provider: LAURENCE SLATER PARAS Date of discharge: 04/19/2023  Admitting diagnosis: Cholestasis during pregnancy [O26.649] Intrauterine pregnancy: [redacted]w[redacted]d     Secondary diagnosis:  Principal Problem:   Cholestasis during pregnancy  Additional problems:     Discharge diagnosis: Term Pregnancy Delivered and cholestasis of pregnancy                                               Post partum procedures: None Augmentation: AROM, Pitocin , and Cytotec  Complications: None  Hospital course: Induction of Labor With Cesarean Section   33 y.o. yo [id number] at [redacted]w[redacted]d was admitted to the hospital 04/16/2023 for induction of labor. Patient had a labor course significant for Non-reassuring FHT. The patient went for cesarean section due to Non-Reassuring FHR. Delivery details are as follows: Membrane Rupture Time/Date: 9:25 AM,04/17/2023  Delivery Method:C-Section, Low Transverse Operative Delivery:N/A Details of operation can be found in separate operative Note.  Patient had a postpartum course complicated by none. She is ambulating, tolerating a regular diet, passing flatus, and urinating well.  Patient is discharged home in stable condition on 04/19/23.      Newborn Data: Birth date:04/17/2023 Birth time:10:23 AM Gender:Female Living status:Living Apgars:8 ,9  Weight:2980 g                               Magnesium Sulfate received: No BMZ received: No Rhophylac:N/A MMR:N/A T-DaP:Given prenatally Flu: N/A RSV Vaccine received: No Transfusion:No Immunizations administered:  There is no immunization history on file for this patient.  Physical exam  Vitals:   04/18/23 0506 04/18/23 1430 04/18/23 2230 04/19/23 0514  BP: 116/80 118/72 119/75 102/60  Pulse: 83 80 78 79  Resp: 16 16 16 18   Temp: 98.8 F  (37.1 C) 98.4 F (36.9 C) 98 F (36.7 C) 98.6 F (37 C)  TempSrc:  Oral Oral   SpO2:  98%    Weight:      Height:       General: alert and cooperative Lochia: appropriate Uterine Fundus: firm Incision: Healing well with no significant drainage Abd NON-distended, normal bs DVT Evaluation: No evidence of DVT seen on physical exam. Labs: Lab Results  Component Value Date   WBC 10.8 (H) 04/18/2023   HGB 10.0 (L) 04/18/2023   HCT 28.1 (L) 04/18/2023   MCV 86.5 04/18/2023   PLT 240 04/18/2023      Latest Ref Rng & Units 04/16/2023    5:30 PM  CMP  Glucose 70 - 99 mg/dL 81   BUN 6 - 20 mg/dL <5   Creatinine 9.55 - 1.00 mg/dL 9.13   Sodium 864 - 854 mmol/L 136   Potassium 3.5 - 5.1 mmol/L 4.3   Chloride 98 - 111 mmol/L 103   CO2 22 - 32 mmol/L 25   Calcium 8.9 - 10.3 mg/dL 9.1   Total Protein 6.5 - 8.1 g/dL 7.1   Total Bilirubin <8.7 mg/dL 1.0   Alkaline Phos 38 - 126 U/L 255   AST 15 - 41 U/L 25   ALT 0 - 44 U/L 29    Edinburgh Score:    04/17/2023    6:01 PM  Edinburgh Postnatal Depression Scale Screening Tool  I have been able to laugh and see the funny side of things. 0  I have looked forward with enjoyment to things. 0  I have blamed myself unnecessarily when things went wrong. 0  I have been anxious or worried for no good reason. 0  I have felt scared or panicky for no good reason. 0  Things have been getting on top of me. 0  I have been so unhappy that I have had difficulty sleeping. 0  I have felt sad or miserable. 0  I have been so unhappy that I have been crying. 0  The thought of harming myself has occurred to me. 0  Edinburgh Postnatal Depression Scale Total 0      After visit meds:  Allergies as of 04/19/2023       Reactions   Peanut-containing Drug Products Anaphylaxis        Medication List     STOP taking these medications    docusate sodium  100 MG capsule Commonly known as: Colace       TAKE these medications    albuterol  108  (90 Base) MCG/ACT inhaler Commonly known as: VENTOLIN  HFA Inhale 1-2 puffs into the lungs every 6 (six) hours as needed for wheezing or shortness of breath.   fluticasone  50 MCG/ACT nasal spray Commonly known as: FLONASE  Place 2 sprays into both nostrils daily for 14 days.   ibuprofen  600 MG tablet Commonly known as: ADVIL  Take 1 tablet (600 mg total) by mouth every 6 (six) hours. What changed:  medication strength how much to take when to take this reasons to take this   oxyCODONE  5 MG immediate release tablet Commonly known as: Oxy IR/ROXICODONE  Take 1-2 tablets (5-10 mg total) by mouth every 4 (four) hours as needed for moderate pain (pain score 4-6). What changed:  how much to take when to take this reasons to take this         Discharge home in stable condition Infant Feeding: Bottle and Breast Infant Disposition:home with mother Discharge instruction: per After Visit Summary and Postpartum booklet. Activity: Advance as tolerated. Pelvic rest for 6 weeks.  Diet: routine diet Anticipated Birth Control: Unsure Postpartum Appointment:6 weeks Additional Postpartum F/U:  None Future Appointments:No future appointments. Follow up Visit:      04/19/2023 Kelly Delon Milian, MD

## 2023-04-25 ENCOUNTER — Telehealth (HOSPITAL_COMMUNITY): Payer: Self-pay

## 2023-04-25 ENCOUNTER — Encounter (HOSPITAL_COMMUNITY): Payer: 59

## 2023-04-25 NOTE — Telephone Encounter (Signed)
 04/25/2023 1524  Name: [name] MRN: [id number] DOB: [birth year]  Reason for Call:  Transition of Care Hospital Discharge Call  Contact Status: Patient Contact Status: Message  Language assistant needed:          Follow-Up Questions:    Van Postnatal Depression Scale:  In the Past 7 Days:    PHQ2-9 Depression Scale:     Discharge Follow-up:    Post-discharge interventions: NA  Signature  Rosaline Deretha PEAK

## 2023-08-09 IMAGING — US US OB < 14 WEEKS - US OB TV
1 of 2 series · 13 of 28 positions shown · non-contrast
Comparison: None.

CLINICAL DATA: Right lower quadrant pain and vaginal bleeding.
Estimated gestational age of 9 weeks, 5 days by LMP.

EXAM:
OBSTETRIC <14 WK US AND TRANSVAGINAL OB US
TECHNIQUE: Both transabdominal and transvaginal ultrasound examinations were
performed for complete evaluation of the gestation as well as the
maternal uterus, adnexal regions, and pelvic cul-de-sac.
Transvaginal technique was performed to assess early pregnancy.

[Series 1: us ob < 14 weeks - us ob tv · 56 acquisitions, 13 frames shown]
[im 3/56]
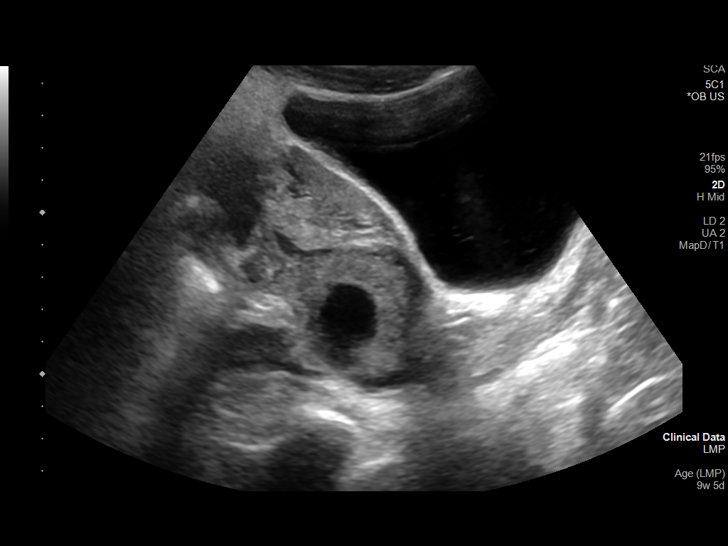
[im 7/56]
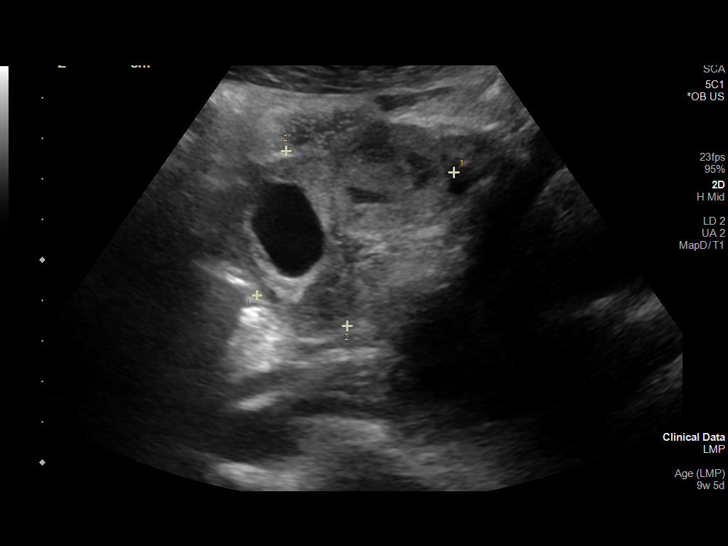
[im 11/56]
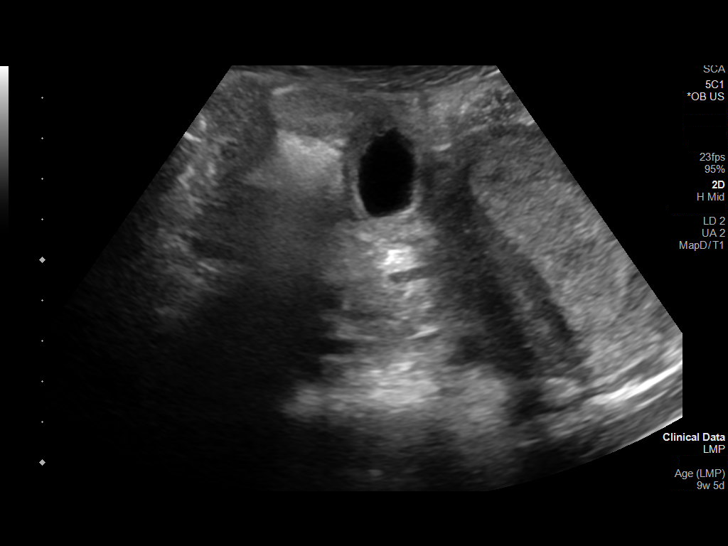
[im 15/56]
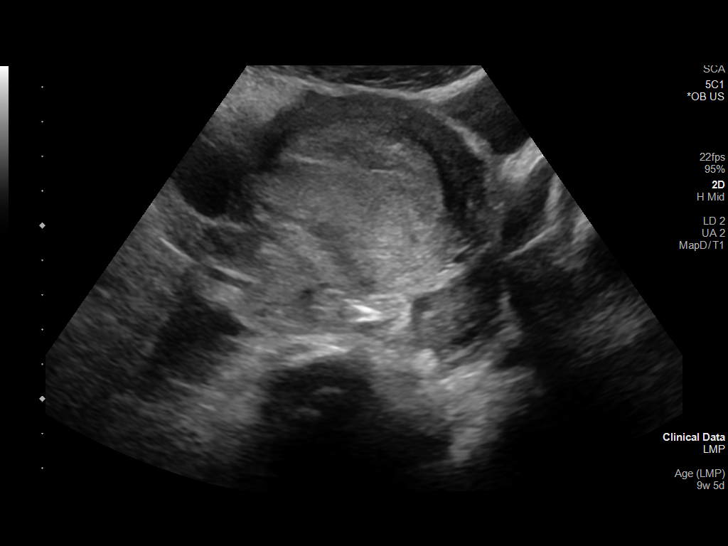
[im 20/56]
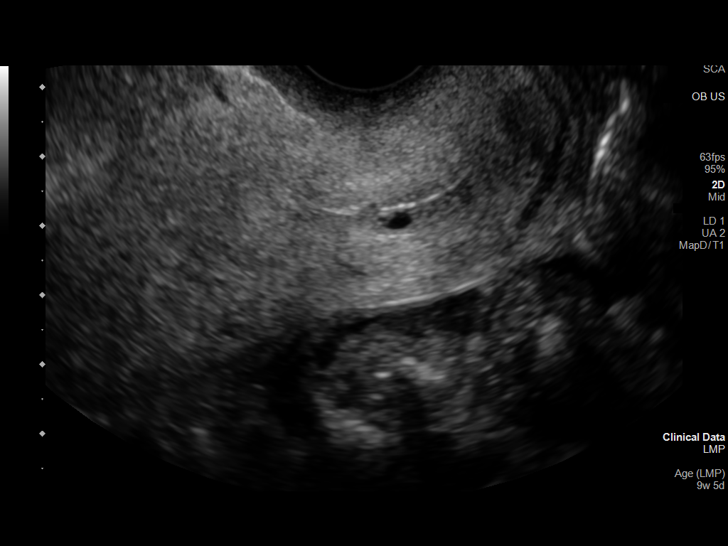
[im 24/56]
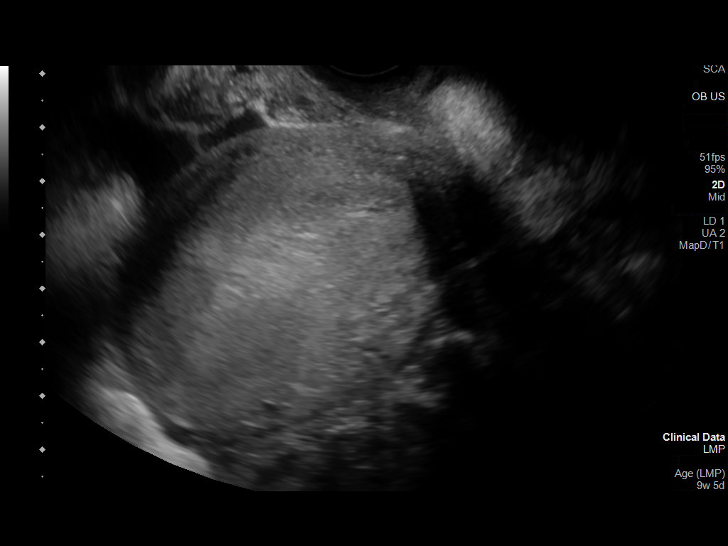
[im 30/56]
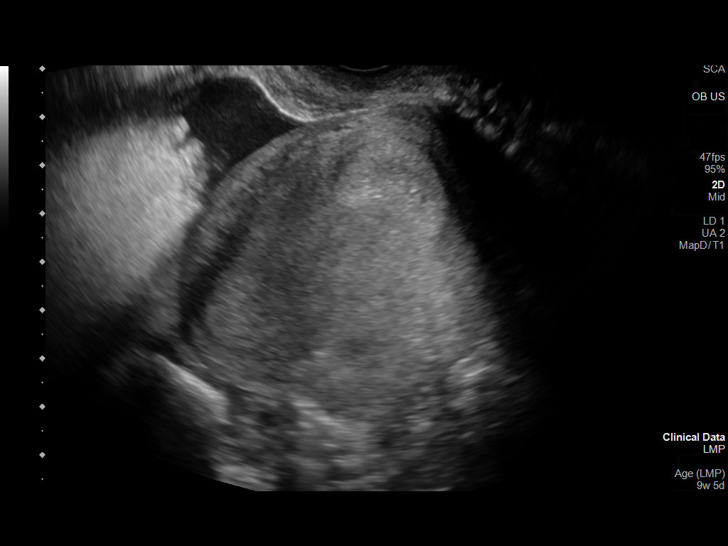
[im 34/56]
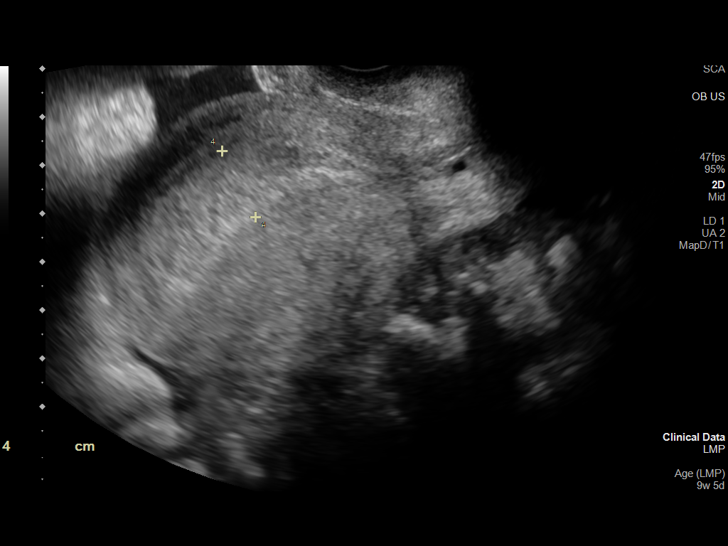
[im 39/56]
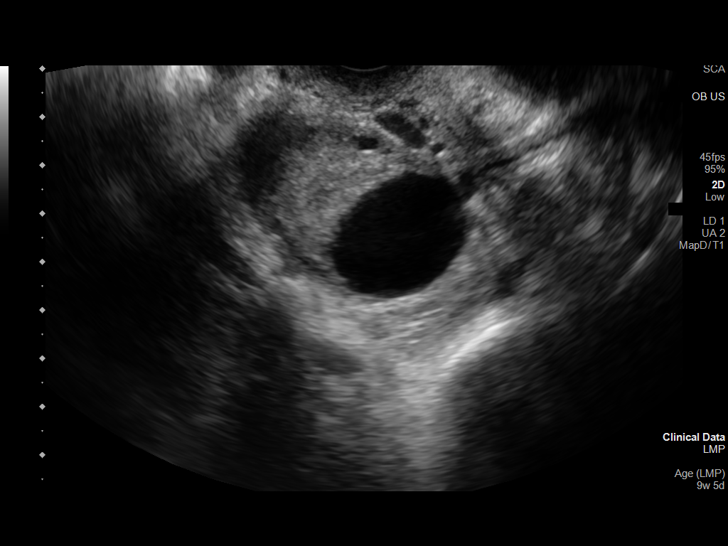
[im 43/56]
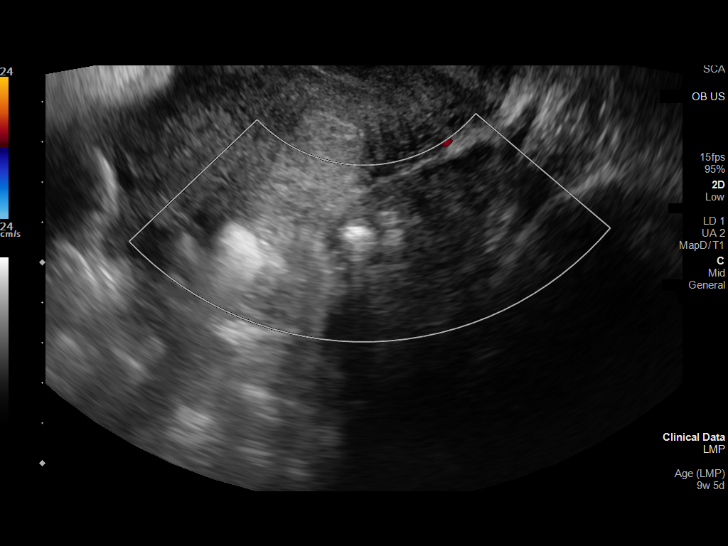
[im 47/56]
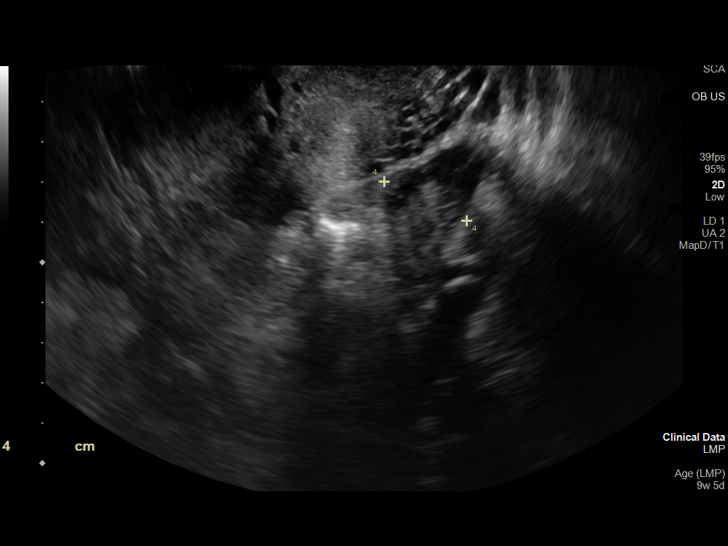
[im 51/56]
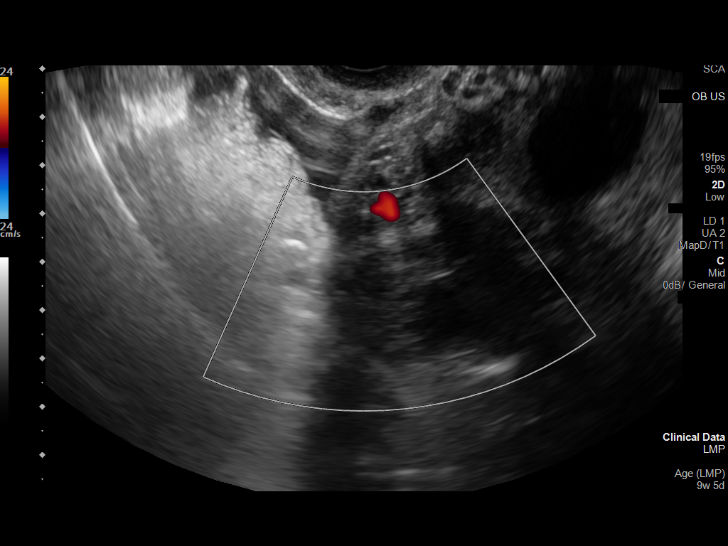
[im 56/56]
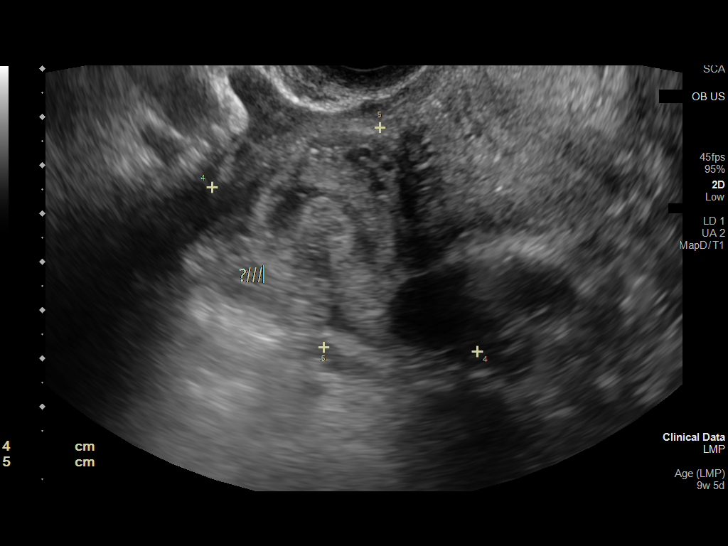

[13 of 28 positions shown; findings below may reference images not displayed]

FINDINGS: Intrauterine gestational sac: None.

Maternal uterus/adnexae: There is a complex mass in the right adnexa
with increased flow, measuring 5.7 x 4.6 x 6.5 cm. This appears
separate from the right ovary.

3.1 cm simple cyst in the right ovary. No follow up imaging
recommended. Normal left ovary.

Small amount of free fluid in the pelvis.
IMPRESSION: 1. No intrauterine pregnancy. Complex right adnexal mass separate
from the right ovary, concerning for ectopic pregnancy.

Critical Value/emergent results were called by telephone at the time
of interpretation on 12/22/2020 at [DATE] to provider HOOLINGCE MERDEKA ,
who verbally acknowledged these results.
